# Patient Record
Sex: Male | Born: 1990 | Race: Black or African American | Hispanic: No | Marital: Single | Smoking: Current every day smoker
Health system: Southern US, Community
[De-identification: ages and names within clinical notes are randomized; demographics above are authoritative.]

## PROBLEM LIST (undated history)

## (undated) DIAGNOSIS — R51 Headache: Secondary | ICD-10-CM

## (undated) DIAGNOSIS — R519 Headache, unspecified: Secondary | ICD-10-CM

---
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
## 2011-01-24 LAB — POCT I-STAT, CHEM 8
BUN: 16 mg/dL (ref 6–23)
Chloride: 104 mEq/L (ref 96–112)
Creatinine, Ser: 1.4 mg/dL (ref 0.4–1.5)
Sodium: 139 mEq/L (ref 135–145)
TCO2: 27 mmol/L (ref 0–100)
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
## 2011-05-08 ENCOUNTER — Emergency Department (HOSPITAL_COMMUNITY)
Admission: EM | Admit: 2011-05-08 | Discharge: 2011-05-08 | Disposition: A | Discharge status: Home / Self Care | Payer: Self-pay | Attending: Emergency Medicine | Admitting: Emergency Medicine

## 2011-05-08 ENCOUNTER — Emergency Department (HOSPITAL_COMMUNITY): Payer: Self-pay

## 2011-05-08 DIAGNOSIS — S01501A Unspecified open wound of lip, initial encounter: Secondary | ICD-10-CM | POA: Insufficient documentation

## 2011-05-08 DIAGNOSIS — R51 Headache: Secondary | ICD-10-CM | POA: Insufficient documentation

## 2011-05-08 DIAGNOSIS — S022XXA Fracture of nasal bones, initial encounter for closed fracture: Secondary | ICD-10-CM | POA: Insufficient documentation

## 2011-05-08 DIAGNOSIS — R221 Localized swelling, mass and lump, neck: Secondary | ICD-10-CM | POA: Insufficient documentation

## 2011-05-08 DIAGNOSIS — R220 Localized swelling, mass and lump, head: Secondary | ICD-10-CM | POA: Insufficient documentation

## 2011-05-08 DIAGNOSIS — S025XXA Fracture of tooth (traumatic), initial encounter for closed fracture: Secondary | ICD-10-CM | POA: Insufficient documentation

## 2012-04-05 ENCOUNTER — Encounter (HOSPITAL_COMMUNITY): Payer: Self-pay

## 2012-04-05 ENCOUNTER — Emergency Department (INDEPENDENT_AMBULATORY_CARE_PROVIDER_SITE_OTHER)
Admission: EM | Admit: 2012-04-05 | Discharge: 2012-04-05 | Disposition: A | Discharge status: Home Health | Payer: Self-pay | Source: Home / Self Care | Attending: Emergency Medicine | Admitting: Emergency Medicine

## 2012-04-05 DIAGNOSIS — J020 Streptococcal pharyngitis: Secondary | ICD-10-CM

## 2012-04-05 LAB — POCT RAPID STREP A: Streptococcus, Group A Screen (Direct): POSITIVE — AB

## 2012-04-05 MED ORDER — PENICILLIN G BENZATHINE 1200000 UNIT/2ML IM SUSP
1.2000 10*6.[IU] | Freq: Once | INTRAMUSCULAR | Status: AC
  Administered 2012-04-05: 1.2 10*6.[IU] via INTRAMUSCULAR

## 2012-04-05 MED ORDER — PENICILLIN G BENZATHINE 1200000 UNIT/2ML IM SUSP
INTRAMUSCULAR | Status: AC
  Filled 2012-04-05: qty 2

## 2012-04-05 MED ORDER — HYDROMORPHONE HCL PF 1 MG/ML IJ SOLN
2.0000 mg | Freq: Once | INTRAMUSCULAR | Status: AC
  Administered 2012-04-05: 2 mg via INTRAMUSCULAR

## 2012-04-05 MED ORDER — HYDROMORPHONE HCL PF 1 MG/ML IJ SOLN
INTRAMUSCULAR | Status: AC
  Filled 2012-04-05: qty 2

## 2012-04-05 MED ORDER — ONDANSETRON HCL 4 MG/2ML IJ SOLN
4.0000 mg | Freq: Once | INTRAMUSCULAR | Status: AC
  Administered 2012-04-05: 4 mg via INTRAMUSCULAR

## 2012-04-05 MED ORDER — ONDANSETRON HCL 4 MG/2ML IJ SOLN
INTRAMUSCULAR | Status: AC
  Filled 2012-04-05: qty 2

## 2012-04-05 NOTE — Discharge Instructions (Signed)
Strep Infections Streptococcal (strep) infections are caused by streptococcal germs (bacteria). Strep infections are very contagious. Strep infections can occur in:  Ears.   The nose.   The throat.   Sinuses.   Skin.   Blood.   Lungs.   Spinal fluid.   Urine.  Strep throat is the most common bacterial infection in children. The symptoms of a Strep infection usually get better in 2 to 3 days after starting medicine that kills germs (antibiotics). Strep is usually not contagious after 36 to 48 hours of antibiotic treatment. Strep infections that are not treated can cause serious complications. These include gland infections, throat abscess, rheumatic fever and kidney disease. DIAGNOSIS  The diagnosis of strep is made by:  A culture for the strep germ.  TREATMENT  These infections require oral antibiotics for a full 10 days, an antibiotic shot or antibiotics given into the vein (intravenous, IV). HOME CARE INSTRUCTIONS   Be sure to finish all antibiotics even if feeling better.   Only take over-the-counter medicines for pain, discomfort and or fever, as directed by your caregiver.   Close contacts that have a fever, sore throat or illness symptoms should see their caregiver right away.   You or your child may return to work, school or daycare if the fever and pain are better in 2 to 3 days after starting antibiotics.  SEEK MEDICAL CARE IF:   You or your child has an oral temperature above 102 F (38.9 C).   Your baby is older than 3 months with a rectal temperature of 100.5 F (38.1 C) or higher for more than 1 day.   You or your child is not better in 3 days.  SEEK IMMEDIATE MEDICAL CARE IF:   You or your child has an oral temperature above 102 F (38.9 C), not controlled by medicine.   Your baby is older than 3 months with a rectal temperature of 102 F (38.9 C) or higher.   Your baby is 67 months old or younger with a rectal temperature of 100.4 F (38 C) or  higher.   There is a spreading rash.   There is difficulty swallowing or breathing.   There is increased pain or swelling.  Document Released: 11/03/2004 Document Revised: 09/15/2011 Document Reviewed: 08/12/2009 [facility] Patient Information 2012 [place], [place].   Go to www.goodrx.com to look up your medications. This will give you a list of where you can find your prescriptions at the most affordable prices.   RESOURCE GUIDE  Dental Problems  Look up DebtSupply.pl.asp for a schedule of the [facility]'s free dental clinics called [address] of [place]. They have clinics all around [place]. Get there early and be prepared to wait.   [facility] [address]  [place], [place] [address] [phone]  [facility] [address] [place], [place] [phone]  Patients with Medicaid: [facility] 401 063 [address]                                [phone] [address] [employer] Phone:  [phone]  Phone:  [phone]  If unable to pay or uninsured, contact:  [facility] or [facility]. to become qualified for the adult dental clinic.

## 2012-04-05 NOTE — ED Notes (Signed)
Pt c/o HA and sore throat onset Tuesday. Pt has white patches on throat upon inspection.  Pt took Aleve last night with no relief.

## 2012-04-05 NOTE — ED Provider Notes (Signed)
Chief Complaint  Patient presents with  . Headache  . Sore Throat    History of Present Illness:   Norman Stewart is a 21 year old male with a three-day history of severe sore throat, pain on swallowing, headache, fever, nausea, abdominal pain. He denies any earache, coughing, wheezing, shortness of breath, vomiting, or diarrhea. He thinks he may have been exposed to strep throat in a coworker. He also recalls having strep throat several years ago.  Review of Systems:  Other than as noted above, the patient denies any of the following symptoms. Systemic:  No fever, chills, sweats, fatigue, myalgias, headache, or anorexia. Eye:  No redness, pain or drainage. ENT:  No earache, ear congestion, nasal congestion, sneezing, rhinorrhea, sinus pressure, sinus pain, or post nasal drip. Lungs:  No cough, sputum production, wheezing, shortness of breath, or chest pain. GI:  No abdominal pain, nausea, vomiting, or diarrhea. Skin:  No rash or itching.  PMFSH:  Past medical history, family history, social history, meds, allergies, and nurse's notes were reviewed.  There is no known exposure to strep or mono.  No prior history of step or mono.  The patient denies use of tobacco.  Physical Exam:   Vital signs:  BP 121/73  Pulse 100  Temp 100.1 F (37.8 C) (Oral)  Resp 18  SpO2 97% General:  Alert, in significant distress due to sore throat and headache. Eye:  No conjunctival injection or drainage. Lids were normal. ENT:  TMs and canals were normal, without erythema or inflammation.  Nasal mucosa was clear and uncongested, without drainage.  Mucous membranes were moist.  Exam of pharynx reveals tonsils to be enlarged, red, and coated with white exudate.  There were no oral ulcerations or lesions. Neck:  Supple, with bilateral, tender anterior cervical adenopathy. Lungs:  No respiratory distress.  Lungs were clear to auscultation, without wheezes, rales or rhonchi.  Breath sounds were clear and equal bilaterally.    Heart:  Regular rhythm, without gallops, murmers or rubs. Skin:  Clear, warm, and dry, without rash or lesions.  Labs:   Results for orders placed during the hospital encounter of 04/05/12  POCT RAPID STREP A ([facility] CARE ONLY)      Component Value Range   Streptococcus, Group A Screen (Direct) POSITIVE (*) NEGATIVE   Course in Urgent Care Center:   Due to his severe discomfort, he was given hydromorphone 2 mg IM and Zofran 4 mg IM. Thereafter he was given Bicillin LA 1.2 million units IM. He tolerated all these injections well without any immediate side effects.  Assessment:  The encounter diagnosis was Strep throat.  Plan:   1.  The following meds were prescribed:   New Prescriptions   No medications on file   2.  The patient was instructed in symptomatic care including hot saline gargles, throat lozenges, infectious precautions, and need to trade out toothbrush. Handouts were given. 3.  The patient was told to return if becoming worse in any way, if no better in 3 or 4 days, and given some red flag symptoms that would indicate earlier return.    Reuben Likes, MD 04/05/12 2225
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
## 2013-09-11 ENCOUNTER — Emergency Department (HOSPITAL_COMMUNITY)
Admission: EM | Admit: 2013-09-11 | Discharge: 2013-09-11 | Disposition: A | Discharge status: Home / Self Care | Payer: Self-pay | Attending: Emergency Medicine | Admitting: Emergency Medicine

## 2013-09-11 ENCOUNTER — Encounter (HOSPITAL_COMMUNITY): Payer: Self-pay | Admitting: Emergency Medicine

## 2013-09-11 DIAGNOSIS — K0889 Other specified disorders of teeth and supporting structures: Secondary | ICD-10-CM

## 2013-09-11 DIAGNOSIS — F17200 Nicotine dependence, unspecified, uncomplicated: Secondary | ICD-10-CM | POA: Insufficient documentation

## 2013-09-11 DIAGNOSIS — K089 Disorder of teeth and supporting structures, unspecified: Secondary | ICD-10-CM | POA: Insufficient documentation

## 2013-09-11 MED ORDER — TRAMADOL HCL 50 MG PO TABS: 50.0000 mg | ORAL_TABLET | Freq: Four times a day (QID) | ORAL | Status: DC | PRN

## 2013-09-11 MED ORDER — TRAMADOL HCL 50 MG PO TABS
50.0000 mg | ORAL_TABLET | Freq: Once | ORAL | Status: AC
  Administered 2013-09-11: 50 mg via ORAL
  Filled 2013-09-11: qty 1

## 2013-09-11 MED ORDER — PENICILLIN V POTASSIUM 500 MG PO TABS: 500.0000 mg | ORAL_TABLET | Freq: Four times a day (QID) | ORAL | Status: AC

## 2013-09-11 NOTE — ED Notes (Signed)
Pt. Stated that last Friday feeling a toothache on lower left side. Denies fever, N/V. Laying down tonight and it starting feeling worse tonight not able to eat what he would like too. States gum feels swollen on that side.

## 2013-09-11 NOTE — ED Provider Notes (Signed)
CSN: [id number]     Arrival date & time 09/11/13  2016 History  This chart was scribed for non-physician practitioner Santiago Glad working with Juliet Rude. Rubin Payor, MD by Carl Best, ED Scribe. This patient was seen in room TR11C/TR11C and the patient's care was started at 9:19 PM.     Chief Complaint  Patient presents with  . Dental Pain    Patient is a 22 y.o. male presenting with tooth pain. The history is provided by the patient. No language interpreter was used.  Dental Pain Associated symptoms: no fever    HPI Comments: Norman Stewart is a 22 y.o. male who presents to the Emergency Department complaining of constant, worsening pain to his lower left gum that started 5 days ago.  The patient states that he has taken Pain-Away with no relief in his symptoms.  He denies fever, chills, dysphagia, nausea, and emesis as associated symptoms.  He currently does not have a dentist.    History reviewed. No pertinent past medical history. History reviewed. No pertinent past surgical history. No family history on file. History  Substance Use Topics  . Smoking status: Current Every Day Smoker -- 1.00 packs/day    Types: Cigarettes  . Smokeless tobacco: Not on file  . Alcohol Use: Yes     Comment: weekends    Review of Systems  Constitutional: Negative for fever and chills.  HENT: Positive for dental problem. Negative for trouble swallowing.   Gastrointestinal: Negative for nausea and vomiting.  All other systems reviewed and are negative.    Allergies  Review of patient's allergies indicates no known allergies.  Home Medications  No current outpatient prescriptions on file.  Triage Vitals: BP 138/84  Pulse 92  Temp(Src) 98.5 F (36.9 C) (Oral)  Resp 18  Ht 5\' 6"  (1.676 m)  Wt 164 lb 6 oz (74.56 kg)  BMI 26.54 kg/m2  SpO2 100%  Physical Exam  Nursing note and vitals reviewed. Constitutional: He is oriented to person, place, and time. He appears well-developed and  well-nourished. No distress.  HENT:  Head: Normocephalic and atraumatic.  Mouth/Throat: Uvula is midline, oropharynx is clear and moist and mucous membranes are normal. No trismus in the jaw. Abnormal dentition. No dental abscesses or uvula swelling. No oropharyngeal exudate, posterior oropharyngeal edema, posterior oropharyngeal erythema or tonsillar abscesses.  Poor dental hygiene. Pt able to open and close mouth with out difficulty. Airway intact. Uvula midline. Mild gingival swelling with tenderness over affected area, but no fluctuance. No swelling or tenderness of submental and submandibular regions.  No sublingual tenderness or tongue elevation.  Eyes: Conjunctivae and EOM are normal.  Neck: Normal range of motion and full passive range of motion without pain. Neck supple. No tracheal deviation present.  Cardiovascular: Normal rate, regular rhythm and normal heart sounds.   Pulmonary/Chest: Effort normal and breath sounds normal. No stridor. No respiratory distress. He has no wheezes.  Musculoskeletal: Normal range of motion.  Lymphadenopathy:       Head (right side): No submental, no submandibular, no tonsillar, no preauricular and no posterior auricular adenopathy present.       Head (left side): No submental, no submandibular, no tonsillar, no preauricular and no posterior auricular adenopathy present.    He has no cervical adenopathy.  Neurological: He is alert and oriented to person, place, and time.  Skin: Skin is warm and dry. No rash noted. He is not diaphoretic.  Psychiatric: He has a normal mood and affect. His behavior  is normal.    ED Course  Procedures (including critical care time)  DIAGNOSTIC STUDIES: Oxygen Saturation is 100% on room air, normal by my interpretation.    COORDINATION OF CARE: 9:21 PM- Discussed administering an antibiotic in the ED and discharging the patient with pain medication.  Advised the patient to follow-up with his dentist to get his tooth  removed.  The patient agreed to the treatment plan.    Labs Review Labs Reviewed - No data to display Imaging Review No results found.  EKG Interpretation   None       MDM  No diagnosis found. Patient with toothache.  No gross abscess.  Exam unconcerning for Ludwig's angina or spread of infection.  Will treat with penicillin and pain medicine.  Urged patient to follow-up with dentist.    I personally performed the services described in this documentation, which was scribed in my presence. The recorded information has been reviewed and is accurate.    Santiago Glad, PA-C 09/11/13 2155

## 2013-09-12 NOTE — ED Provider Notes (Signed)
Medical screening examination/treatment/procedure(s) were performed by non-physician practitioner and as supervising physician I was immediately available for consultation/collaboration.  EKG Interpretation   None        Juliet Rude. Rubin Payor, MD 09/12/13 [id number]

## 2014-05-09 ENCOUNTER — Encounter (HOSPITAL_COMMUNITY): Payer: Self-pay | Admitting: Emergency Medicine

## 2014-05-09 ENCOUNTER — Emergency Department (HOSPITAL_COMMUNITY)
Admission: EM | Admit: 2014-05-09 | Discharge: 2014-05-09 | Disposition: A | Discharge status: Home / Self Care | Payer: Self-pay | Attending: Emergency Medicine | Admitting: Emergency Medicine

## 2014-05-09 DIAGNOSIS — S01501A Unspecified open wound of lip, initial encounter: Secondary | ICD-10-CM | POA: Insufficient documentation

## 2014-05-09 DIAGNOSIS — F17200 Nicotine dependence, unspecified, uncomplicated: Secondary | ICD-10-CM | POA: Insufficient documentation

## 2014-05-09 DIAGNOSIS — S01511A Laceration without foreign body of lip, initial encounter: Secondary | ICD-10-CM

## 2014-05-09 MED ORDER — AMOXICILLIN 500 MG PO CAPS: 1000.0000 mg | ORAL_CAPSULE | Freq: Two times a day (BID) | ORAL | Status: DC

## 2014-05-09 MED ORDER — TRAMADOL HCL 50 MG PO TABS: 50.0000 mg | ORAL_TABLET | Freq: Four times a day (QID) | ORAL | Status: DC | PRN

## 2014-05-09 MED ORDER — TRAMADOL HCL 50 MG PO TABS
50.0000 mg | ORAL_TABLET | Freq: Once | ORAL | Status: AC
Start: 2014-05-09 — End: 2014-05-09
  Administered 2014-05-09: 50 mg via ORAL
  Filled 2014-05-09: qty 1

## 2014-05-09 MED ORDER — AMOXICILLIN 500 MG PO CAPS
1000.0000 mg | ORAL_CAPSULE | Freq: Once | ORAL | Status: AC
  Administered 2014-05-09: 1000 mg via ORAL
  Filled 2014-05-09: qty 2
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
## 2014-05-09 NOTE — Discharge Instructions (Signed)
Absorbable Suture Repair Absorbable sutures (stitches) hold skin together so you can heal. Keep skin wounds clean and dry for the next 2 to 3 days. Then, you may gently wash your wound and dress it with an antibiotic ointment as recommended. As your wound begins to heal, the sutures are no longer needed, and they typically begin to fall off. This will take 7 to 10 days. After 10 days, if your sutures are loose, you can remove them by wiping with a clean gauze pad or a cotton ball. Do not pull your sutures out. They should wipe away easily. If after 10 days they do not easily wipe away, have your caregiver take them out. Absorbable sutures may be used deep in a wound to help hold it together. If these stitches are below the skin, the body will absorb them completely in 3 to 4 weeks.  You may need a tetanus shot if:  You cannot remember when you had your last tetanus shot.  You have never had a tetanus shot. If you get a tetanus shot, your arm may swell, get red, and feel warm to the touch. This is common and not a problem. If you need a tetanus shot and you choose not to have one, there is a rare chance of getting tetanus. Sickness from tetanus can be serious. SEEK IMMEDIATE MEDICAL CARE IF:  You have redness in the wound area.  The wound area feels hot to the touch.  You develop swelling in the wound area.  You develop pain.  There is fluid drainage from the wound. Document Released: 11/03/2004 Document Revised: 12/19/2011 Document Reviewed: 02/15/2011 [facility] Patient Information 2015 [place], [place]. This information is not intended to replace advice given to you by your health care provider. Make sure you discuss any questions you have with your health care provider.  Amoxicillin capsules or tablets What is this medicine? AMOXICILLIN (a mox i SIL in) is a penicillin antibiotic. It is used to treat certain kinds of bacterial infections. It will not work for colds, flu, or other viral  infections. This medicine may be used for other purposes; ask your health care provider or pharmacist if you have questions. COMMON BRAND NAME(S): Amoxil, Moxilin, Sumox, Trimox What should I tell my health care provider before I take this medicine? They need to know if you have any of these conditions: -asthma -kidney disease -an unusual or allergic reaction to amoxicillin, other penicillins, cephalosporin antibiotics, other medicines, foods, dyes, or preservatives -pregnant or trying to get pregnant -breast-feeding How should I use this medicine? Take this medicine by mouth with a glass of water. Follow the directions on your prescription label. You may take this medicine with food or on an empty stomach. Take your medicine at regular intervals. Do not take your medicine more often than directed. Take all of your medicine as directed even if you think your are better. Do not skip doses or stop your medicine early. Talk to your pediatrician regarding the use of this medicine in children. While this drug may be prescribed for selected conditions, precautions do apply. Overdosage: If you think you have taken too much of this medicine contact a poison control center or emergency room at once. NOTE: This medicine is only for you. Do not share this medicine with others. What if I miss a dose? If you miss a dose, take it as soon as you can. If it is almost time for your next dose, take only that dose. Do not take  double or extra doses. What may interact with this medicine? -amiloride -birth control pills -chloramphenicol -macrolides -probenecid -sulfonamides -tetracyclines This list may not describe all possible interactions. Give your health care provider a list of all the medicines, herbs, non-prescription drugs, or dietary supplements you use. Also tell them if you smoke, drink alcohol, or use illegal drugs. Some items may interact with your medicine. What should I watch for while using this  medicine? Tell your doctor or health care professional if your symptoms do not improve in 2 or 3 days. Take all of the doses of your medicine as directed. Do not skip doses or stop your medicine early. If you are diabetic, you may get a false positive result for sugar in your urine with certain brands of urine tests. Check with your doctor. Do not treat diarrhea with over-the-counter products. Contact your doctor if you have diarrhea that lasts more than 2 days or if the diarrhea is severe and watery. What side effects may I notice from receiving this medicine? Side effects that you should report to your doctor or health care professional as soon as possible: -allergic reactions like skin rash, itching or hives, swelling of the face, lips, or tongue -breathing problems -dark urine -redness, blistering, peeling or loosening of the skin, including inside the mouth -seizures -severe or watery diarrhea -trouble passing urine or change in the amount of urine -unusual bleeding or bruising -unusually weak or tired -yellowing of the eyes or skin Side effects that usually do not require medical attention (report to your doctor or health care professional if they continue or are bothersome): -dizziness -headache -stomach upset -trouble sleeping This list may not describe all possible side effects. Call your doctor for medical advice about side effects. You may report side effects to FDA at 1-800-FDA-1088. Where should I keep my medicine? Keep out of the reach of children. Store between 68 and 77 degrees F (20 and 25 degrees C). Keep bottle closed tightly. Throw away any unused medicine after the expiration date. NOTE: This sheet is a summary. It may not cover all possible information. If you have questions about this medicine, talk to your doctor, pharmacist, or health care provider.  2015, [employer]. (2007-12-18 14:10:59)
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
## 2014-05-09 NOTE — ED Notes (Signed)
PA at bedside to repair laceration.

## 2014-05-09 NOTE — ED Provider Notes (Signed)
LACERATION REPAIR Performed by: Louann SjogrenVictoria L Mattheu Brodersen Authorized by: Louann SjogrenVictoria L Shoshanah Dapper Consent: Verbal consent obtained. Risks and benefits: risks, benefits and alternatives were discussed Consent given by: patient Patient identity confirmed: provided demographic data Prepped and Draped in normal sterile fashion Wound explored  Laceration Location: left lateral upper lip below the fold  Laceration Length: 3.5 cm  No Foreign Bodies seen or palpated  Anesthesia: local infiltration  Local anesthetic: lidocaine 1% without epinephrine  Anesthetic total: 10 ml  Irrigation method: syringe Amount of cleaning: standard  Skin closure: 4-0 Vicryl Rapide  Number of sutures: 10    Technique: Muscular fascial layer was closed with 1 simple interrupted. Mucosal layer was better approximated after muscular layer closure and was not closed. Subcutaneous layer was closed with 9 SubQ with 1 subcuticular running suture  Bacitracin ointment was used.   Patient tolerance: Patient tolerated the procedure well with no immediate complications.    Louann SjogrenVictoria L Tamia Dial, PA-C 05/09/14 [phone]

## 2014-10-07 ENCOUNTER — Emergency Department (HOSPITAL_COMMUNITY)
Admission: EM | Admit: 2014-10-07 | Discharge: 2014-10-07 | Disposition: A | Discharge status: Home / Self Care | Payer: Self-pay | Attending: Emergency Medicine | Admitting: Emergency Medicine

## 2014-10-07 ENCOUNTER — Encounter (HOSPITAL_COMMUNITY): Payer: Self-pay | Admitting: Emergency Medicine

## 2014-10-07 DIAGNOSIS — Z720 Tobacco use: Secondary | ICD-10-CM | POA: Insufficient documentation

## 2014-10-07 DIAGNOSIS — Z792 Long term (current) use of antibiotics: Secondary | ICD-10-CM | POA: Insufficient documentation

## 2014-10-07 DIAGNOSIS — J029 Acute pharyngitis, unspecified: Secondary | ICD-10-CM | POA: Insufficient documentation

## 2014-10-07 LAB — RAPID STREP SCREEN (MED CTR MEBANE ONLY): STREPTOCOCCUS, GROUP A SCREEN (DIRECT): NEGATIVE

## 2014-10-07 MED ORDER — LIDOCAINE VISCOUS 2 % MT SOLN: 20.0000 mL | OROMUCOSAL | Status: DC | PRN

## 2014-10-07 MED ORDER — NAPROXEN 500 MG PO TABS: 500.0000 mg | ORAL_TABLET | Freq: Two times a day (BID) | ORAL | Status: DC

## 2014-10-07 NOTE — ED Notes (Signed)
Pt reports sore throat x5 days. Pt sore red and swollen. Pt also has headache and reports not feeling well in general. No breathing issues.

## 2014-10-07 NOTE — ED Provider Notes (Signed)
CSN: [id number]     Arrival date & time 10/07/14  1311 History  This chart was scribed for Santiago GladHeather Sanaiya Welliver, PA-C with Merrie RoofJohn David Wofford III, MD by Tonye RoyaltyJoshua Chen, ED Scribe. This patient was seen in room WTR6/WTR6 and the patient's care was started at 1:51 PM.    No chief complaint on file.  The history is provided by the patient. No language interpreter was used.    HPI Comments: Norman Stewart is a 23 y.o. male who presents to the Emergency Department complaining of sore throat with onset 2-3 days ago, worsening gradually. He reports associated fever, chills, cough and difficulty swallowing. However, he has not taken his temperature.  Temp 98 upon arrival in the ED.  He notes some white material in his pharynx. He states he has had strep throat and tonsillitis before; he still has his tonsils. He denies recent contact with anyone that has strep throat. Denies nausea, vomiting, or difficulty swallowing.    No past medical history on file. No past surgical history on file. No family history on file. History  Substance Use Topics  . Smoking status: Current Every Day Smoker -- 0.50 packs/day    Types: Cigarettes  . Smokeless tobacco: Not on file  . Alcohol Use: Yes     Comment: weekends    Review of Systems  Constitutional: Positive for fever and chills.  HENT: Positive for sore throat. Negative for trouble swallowing and voice change.   Respiratory: Positive for cough.       Allergies  Review of patient's allergies indicates no known allergies.  Home Medications   Prior to Admission medications   Medication Sig Start Date End Date Taking? Authorizing Provider  amoxicillin (AMOXIL) 500 MG capsule Take 2 capsules (1,000 mg total) by mouth 2 (two) times daily. 05/09/14   Dione Boozeavid Glick, MD  traMADol (ULTRAM) 50 MG tablet Take 1 tablet (50 mg total) by mouth every 6 (six) hours as needed. 05/09/14   Dione Boozeavid Glick, MD   BP 120/88 mmHg  Pulse 94  Temp(Src) 98 F (36.7 C) (Oral)   Resp 17  SpO2 98% Physical Exam  Constitutional: He is oriented to person, place, and time. He appears well-developed and well-nourished.  HENT:  Head: Normocephalic and atraumatic.  Right Ear: Tympanic membrane and ear canal normal.  Left Ear: Tympanic membrane and ear canal normal.  Mouth/Throat: Uvula is midline. Oropharyngeal exudate, posterior oropharyngeal edema and posterior oropharyngeal erythema present. No tonsillar abscesses.  Mild erythema and edema of the oropharynx Normal voice phonation Handling secretions well, no drooling  Eyes: Conjunctivae are normal.  Neck: Normal range of motion. Neck supple.  Cardiovascular: Normal rate, regular rhythm and normal heart sounds.   No murmur heard. Pulmonary/Chest: Effort normal and breath sounds normal. No respiratory distress. He has no wheezes. He has no rales.  Musculoskeletal: Normal range of motion.  Neurological: He is alert and oriented to person, place, and time.  Skin: Skin is warm and dry.  Psychiatric: He has a normal mood and affect.  Nursing note and vitals reviewed.   ED Course  Procedures (including critical care time)  DIAGNOSTIC STUDIES: Oxygen Saturation is 98% on room air, normal by my interpretation.    COORDINATION OF CARE: 1:55 PM Discussed treatment plan with patient at beside, the patient agrees with the plan and has no further questions at this time.   Labs Review Labs Reviewed - No data to display  Imaging Review No results found.   EKG Interpretation  None      MDM   Final diagnoses:  None   Patient presents today with sore throat.  Rapid strep negative.  Culture sent.  No signs of Peritonsillar Abscess or Retropharyngeal Abscess.  Patient non toxic appearing.  Feel that the patient is stable for discharge.  Return precautions given.     Santiago GladHeather Onyx Edgley, PA-C 10/08/14 2251  Santiago GladHeather Jerian Morais, PA-C 10/08/14 2251  Merrie RoofJohn David Wofford III, MD 10/09/14 [phone]

## 2014-10-09 LAB — CULTURE, GROUP A STREP

## 2014-10-22 ENCOUNTER — Ambulatory Visit: Payer: Self-pay

## 2014-10-23 ENCOUNTER — Ambulatory Visit: Payer: Self-pay

## 2014-11-23 ENCOUNTER — Emergency Department (HOSPITAL_COMMUNITY)
Admission: EM | Admit: 2014-11-23 | Discharge: 2014-11-23 | Disposition: A | Discharge status: Home / Self Care | Payer: Self-pay | Attending: Emergency Medicine | Admitting: Emergency Medicine

## 2014-11-23 ENCOUNTER — Encounter (HOSPITAL_COMMUNITY): Payer: Self-pay

## 2014-11-23 DIAGNOSIS — K029 Dental caries, unspecified: Secondary | ICD-10-CM | POA: Insufficient documentation

## 2014-11-23 DIAGNOSIS — K088 Other specified disorders of teeth and supporting structures: Secondary | ICD-10-CM | POA: Insufficient documentation

## 2014-11-23 DIAGNOSIS — K0889 Other specified disorders of teeth and supporting structures: Secondary | ICD-10-CM

## 2014-11-23 DIAGNOSIS — Z791 Long term (current) use of non-steroidal anti-inflammatories (NSAID): Secondary | ICD-10-CM | POA: Insufficient documentation

## 2014-11-23 DIAGNOSIS — Z720 Tobacco use: Secondary | ICD-10-CM | POA: Insufficient documentation

## 2014-11-23 DIAGNOSIS — Z792 Long term (current) use of antibiotics: Secondary | ICD-10-CM | POA: Insufficient documentation

## 2014-11-23 MED ORDER — IBUPROFEN 800 MG PO TABS: 800.0000 mg | ORAL_TABLET | Freq: Three times a day (TID) | ORAL | Status: DC | PRN

## 2014-11-23 MED ORDER — PENICILLIN V POTASSIUM 500 MG PO TABS: 500.0000 mg | ORAL_TABLET | Freq: Four times a day (QID) | ORAL | Status: AC

## 2014-11-23 MED ORDER — HYDROCODONE-ACETAMINOPHEN 5-325 MG PO TABS: 1.0000 | ORAL_TABLET | ORAL | Status: DC | PRN

## 2014-11-23 NOTE — ED Provider Notes (Signed)
CSN: [id number]     Arrival date & time 11/23/14  [id number] History   First MD Initiated Contact with Patient 11/23/14 [phone]     No chief complaint on file.    (Consider location/radiation/quality/duration/timing/severity/associated sxs/prior Treatment) The history is provided by the patient.     Patient presents with throbbing right sided dental pain x 1 week.  Worse with eating and drinking.  Denies fevers, sore throat, difficulty swallowing or breathing.    No past medical history on file. No past surgical history on file. No family history on file. History  Substance Use Topics  . Smoking status: Current Every Day Smoker -- 0.50 packs/day    Types: Cigarettes  . Smokeless tobacco: Not on file  . Alcohol Use: Yes     Comment: weekends    Review of Systems  All other systems reviewed and are negative.     Allergies  Review of patient's allergies indicates no known allergies.  Home Medications   Prior to Admission medications   Medication Sig Start Date End Date Taking? Authorizing Provider  amoxicillin (AMOXIL) 500 MG capsule Take 2 capsules (1,000 mg total) by mouth 2 (two) times daily. 05/09/14   Dione Boozeavid Glick, MD  lidocaine (XYLOCAINE) 2 % solution Use as directed 20 mLs in the mouth or throat as needed for mouth pain. 10/07/14   Heather Laisure, PA-C  naproxen (NAPROSYN) 500 MG tablet Take 1 tablet (500 mg total) by mouth 2 (two) times daily. 10/07/14   Heather Laisure, PA-C  traMADol (ULTRAM) 50 MG tablet Take 1 tablet (50 mg total) by mouth every 6 (six) hours as needed. 05/09/14   Dione Boozeavid Glick, MD   BP 148/98 mmHg  Pulse 69  Temp(Src) 98.5 F (36.9 C)  Resp 18  Ht 5\' 5"  (1.651 m)  Wt 155 lb (70.308 kg)  BMI 25.79 kg/m2  SpO2 98% Physical Exam  Constitutional: He appears well-developed and well-nourished. No distress.  HENT:  Head: Normocephalic and atraumatic.  Mouth/Throat: Uvula is midline and oropharynx is clear and moist. Mucous membranes are not dry. No  uvula swelling. No oropharyngeal exudate, posterior oropharyngeal edema, posterior oropharyngeal erythema or tonsillar abscesses.  Multiple areas of severe dental decay.  Right lower 2nd molar identified as painful.  No tenderness to percussion.  No edema.  No facial swelling.    Neck: Normal range of motion. Neck supple.  Cardiovascular: Normal rate.   Pulmonary/Chest: Effort normal and breath sounds normal. No stridor.  Lymphadenopathy:    He has no cervical adenopathy.  Neurological: He is alert.  Skin: He is not diaphoretic.  Nursing note and vitals reviewed.   ED Course  Procedures (including critical care time) Labs Review Labs Reviewed - No data to display  Imaging Review No results found.   EKG Interpretation None      MDM   Final diagnoses:  Pain, dental  Dental caries    Afebrile, nontoxic patient with new dental pain.  No obvious abscess.  No concerning findings on exam.  Doubt deep space head or neck infection.  Doubt Ludwig's angina.  D/C home with antibiotic, pain medication and dental follow up.  Discussed findings, treatment, and follow up  with patient.  Pt given return precautions.  Pt verbalizes understanding and agrees with plan.        Trixie Dredgemily Ciarah Peace, PA-C 11/23/14 [phone]  Candyce ChurnJohn David Wofford III, MD 11/23/14 [phone]

## 2014-11-23 NOTE — Discharge Instructions (Signed)
Read the information below.  Use the prescribed medication as directed.  Please discuss all new medications with your pharmacist.  Do not take additional tylenol while taking the prescribed pain medication to avoid overdose.  You may return to the Emergency Department at any time for worsening condition or any new symptoms that concern you.  Please call the dentist to schedule a close follow up appointment.  If you develop fevers, swelling in your face, difficulty swallowing or breathing, return to the ER immediately for a recheck.     Dental Caries Dental caries (also called tooth decay) is the most common oral disease. It can occur at any age but is more common in children and young adults.  HOW DENTAL CARIES DEVELOPS  The process of decay begins when bacteria and foods (particularly sugars and starches) combine in your mouth to produce plaque. Plaque is a substance that sticks to the hard, outer surface of a tooth (enamel). The bacteria in plaque produce acids that attack enamel. These acids may also attack the root surface of a tooth (cementum) if it is exposed. Repeated attacks dissolve these surfaces and create holes in the tooth (cavities). If left untreated, the acids destroy the other layers of the tooth.  RISK FACTORS  Frequent sipping of sugary beverages.   Frequent snacking on sugary and starchy foods, especially those that easily get stuck in the teeth.   Poor oral hygiene.   Dry mouth.   Substance abuse such as methamphetamine abuse.   Broken or poor-fitting dental restorations.   Eating disorders.   Gastroesophageal reflux disease (GERD).   Certain radiation treatments to the head and neck. SYMPTOMS In the early stages of dental caries, symptoms are seldom present. Sometimes white, chalky areas may be seen on the enamel or other tooth layers. In later stages, symptoms may include:  Pits and holes on the enamel.  Toothache after sweet, hot, or cold foods or drinks  are consumed.  Pain around the tooth.  Swelling around the tooth. DIAGNOSIS  Most of the time, dental caries is detected during a regular dental checkup. A diagnosis is made after a thorough medical and dental history is taken and the surfaces of your teeth are checked for signs of dental caries. Sometimes special instruments, such as lasers, are used to check for dental caries. Dental X-ray exams may be taken so that areas not visible to the eye (such as between the contact areas of the teeth) can be checked for cavities.  TREATMENT  If dental caries is in its early stages, it may be reversed with a fluoride treatment or an application of a remineralizing agent at the dental office. Thorough brushing and flossing at home is needed to aid these treatments. If it is in its later stages, treatment depends on the location and extent of tooth destruction:   If a small area of the tooth has been destroyed, the destroyed area will be removed and cavities will be filled with a material such as gold, silver amalgam, or composite resin.   If a large area of the tooth has been destroyed, the destroyed area will be removed and a cap (crown) will be fitted over the remaining tooth structure.   If the center part of the tooth (pulp) is affected, a procedure called a root canal will be needed before a filling or crown can be placed.   If most of the tooth has been destroyed, the tooth may need to be pulled (extracted). HOME CARE INSTRUCTIONS  You can prevent, stop, or reverse dental caries at home by practicing good oral hygiene. Good oral hygiene includes:  Thoroughly cleaning your teeth at least twice a day with a toothbrush and dental floss.   Using a fluoride toothpaste. A fluoride mouth rinse may also be used if recommended by your dentist or health care provider.   Restricting the amount of sugary and starchy foods and sugary liquids you consume.   Avoiding frequent snacking on these foods and  sipping of these liquids.   Keeping regular visits with a dentist for checkups and cleanings. PREVENTION   Practice good oral hygiene.  Consider a dental sealant. A dental sealant is a coating material that is applied by your dentist to the pits and grooves of teeth. The sealant prevents food from being trapped in them. It may protect the teeth for several years.  Ask about fluoride supplements if you live in a community without fluorinated water or with water that has a low fluoride content. Use fluoride supplements as directed by your dentist or health care provider.  Allow fluoride varnish applications to teeth if directed by your dentist or health care provider. Document Released: 06/18/2002 Document Revised: 02/10/2014 Document Reviewed: 09/28/2012 [facility] Patient Information 2015 [employer], [place]. This information is not intended to replace advice given to you by your health care provider. Make sure you discuss any questions you have with your health care provider.  Dental Pain A tooth ache may be caused by cavities (tooth decay). Cavities expose the nerve of the tooth to air and hot or cold temperatures. It may come from an infection or abscess (also called a boil or furuncle) around your tooth. It is also often caused by dental caries (tooth decay). This causes the pain you are having. DIAGNOSIS  Your caregiver can diagnose this problem by exam. TREATMENT   If caused by an infection, it may be treated with medications which kill germs (antibiotics) and pain medications as prescribed by your caregiver. Take medications as directed.  Only take over-the-counter or prescription medicines for pain, discomfort, or fever as directed by your caregiver.  Whether the tooth ache today is caused by infection or dental disease, you should see your dentist as soon as possible for further care. SEEK MEDICAL CARE IF: The exam and treatment you received today has been provided on an emergency  basis only. This is not a substitute for complete medical or dental care. If your problem worsens or new problems (symptoms) appear, and you are unable to meet with your dentist, call or return to this location. SEEK IMMEDIATE MEDICAL CARE IF:   You have a fever.  You develop redness and swelling of your face, jaw, or neck.  You are unable to open your mouth.  You have severe pain uncontrolled by pain medicine. MAKE SURE YOU:   Understand these instructions.  Will watch your condition.  Will get help right away if you are not doing well or get worse. Document Released: 09/26/2005 Document Revised: 12/19/2011 Document Reviewed: 05/14/2008 [facility] Patient Information 2015 [place], [place]. This information is not intended to replace advice given to you by your health care provider. Make sure you discuss any questions you have with your health care provider.   Emergency Department Resource Guide 1) Find a Doctor and Pay Out of Pocket Although you won't have to find out who is covered by your insurance plan, it is a good idea to ask around and get recommendations. You will then need to call the  office and see if the doctor you have chosen will accept you as a new patient and what types of options they offer for patients who are self-pay. Some doctors offer discounts or will set up payment plans for their patients who do not have insurance, but you will need to ask so you aren't surprised when you get to your appointment.  2) Contact Your Local Health Department Not all health departments have doctors that can see patients for sick visits, but many do, so it is worth a call to see if yours does. If you don't know where your local health department is, you can check in your phone book. The CDC also has a tool to help you locate your state's health department, and many state websites also have listings of all of their local health departments.  3) Find a Walk-in Clinic If your illness is not  likely to be very severe or complicated, you may want to try a walk in clinic. These are popping up all over the country in pharmacies, drugstores, and shopping centers. They're usually staffed by nurse practitioners or physician assistants that have been trained to treat common illnesses and complaints. They're usually fairly quick and inexpensive. However, if you have serious medical issues or chronic medical problems, these are probably not your best option.  No Primary Care Doctor: - Call Health Connect at  [phone] - they can help you locate a primary care doctor that  accepts your insurance, provides certain services, etc. - Physician Referral Service- [phone]  Chronic Pain Problems: Organization         Address  Phone   Notes  [facility]  [phone] Patients need to be referred by their primary care doctor.   Medication Assistance: Organization         Address  Phone   Notes  [facility] Medication [facility] [address] [place], [place] [address] [phone] --Must be a resident of [facility] -- Must have NO insurance coverage whatsoever (no Medicaid/ Medicare, etc.) -- The pt. MUST have a primary care doctor that directs their care regularly and follows them in the community   [facility]  [phone]   [employer]  [phone]    Agencies that provide inexpensive medical care: Organization         Address  Phone   Notes  [facility]  [phone]   [facility]    [phone]   [facility] [address] [place], [place] [address] [phone]   [facility] [address] [place], [place] [phone]   [facility]anned Parenthood    [phone]   [facility]    [phone]   [facility]  [address], [place] Phone:  [phone], Fax:  [phone] Hours of Operation:  9 am - 6 pm,  M-F.  Also accepts Medicaid/Medicare and self-pay.  [facility]  [address], [place] Phone: [phone], Fax: [phone]. Hours of Operation:  8:30 am - 5:30 pm, M-F.  Also accepts Medicaid and self-pay.  [facility] [place] [address] [place] Phone: [phone]   [facility] [address] Natasha Bence [place], [place] [phone], Ext. [phone] Mondays & Thursdays: 7-9 AM.  First 15 patients are seen on a first come, first serve basis.    Medicaid-accepting  [facility] Providers:  Organization         Address  Phone   Notes  [facility] [address], [place] [phone] Also accepts self-pay patients.  [facility] [address] Laurell Josephs [place], [place]  [phone]   [facility] [address], [place] [phone]   [facility] [address] [place] [phone]   Renaye Rakers [address], [place]   [phone] Only accepts [place] Access [place] patients after they have their name applied to their card.   Self-Pay (no insurance) in [facility]:  Organization         Address  Phone   Notes  Sickle Cell Patients, [facility] [address] [place], [place] [phone]   [facility] [address] [place], [place] [phone]   [facility] [place]  [address], [place] [phone]   [facility]/Dr. Osei-Bonsu  [address] [place] or [address], [place] [phone] Phone number for both [place] and [place] locations is the same.  [facility] [address] [place] [phone]   [facility] [address] [place] or [address] [phone]   [facility] [address] [place] [phone], phone; [phone], fax Sees patients 1st and 3rd Saturday of every month.  Must not qualify for public or private insurance (i.e. Medicaid, Medicare, [place] Health Choice, Veterans' Benefits)  Household income should be no more than 200% of the poverty level The clinic cannot treat you if you are pregnant or think you are pregnant  Sexually transmitted diseases are not treated at the clinic.    Dental Care: Organization         Address  Phone  Notes  [facility] Department of [facility] [address] [place], [place] [phone] Accepts children up to age 69 who are enrolled in [place] or [place] Health Choice; pregnant women with a Medicaid card; and children who have applied for Medicaid or [place] Health Choice, but were declined, whose parents can pay a reduced fee at time of service.  [facility]  [address], [place] [phone] Accepts children up to age 19 who are enrolled in [place] or [place] Health Choice; pregnant women with a Medicaid card; and children who have applied for Medicaid or [place] Health Choice, but were declined, whose parents can pay a reduced fee at time of service.  [facility]  [address] [place], [place] [phone] Patients are seen by appointment only. Walk-ins are not accepted. [facility] will see patients 24 years of age and older. Monday - Tuesday (8am-5pm) Most Wednesdays (8:30-5pm) $30 per visit, cash only  [facility]  [address], [facility] [phone] Patients are seen by appointment only. Walk-ins are not accepted. [facility] will see patients 52 years of age and older. One Wednesday Evening (Monthly: Volunteer Based).  $30 per visit, cash only  [employer]  [phone] for adults; Children under age 11, call Graduate Pediatric Dentistry at [phone]. Children aged 23-14, please call [phone] to request a pediatric  application.  Dental services are provided in all areas of dental care including fillings, crowns and bridges, complete and partial dentures, implants, gum treatment, root canals, and extractions. Preventive care is also provided. Treatment is provided to both adults and children. Patients are selected via a lottery and there is often a waiting list.   [facility] [address], [place]  [phone] [email or web address]   [facility] [address] [place], [place] [phone], Ext. [phone] Second and Fourth Thursday of each month, opens at 6:30 AM; Clinic ends at 9 AM.  Patients are seen on a first-come first-served basis, and a limited number are seen during each clinic.   [facility]  [address] Ether Griffins [place], [place] [phone]   Eligibility Requirements You must have lived in [place], [place], or [place] counties for at least the last three months.   You cannot be eligible for state or federal sponsored [place], including [employer], [place], or [employer].   You generally cannot be eligible for healthcare insurance through your employer.    How to apply: Eligibility screenings are held every Tuesday and Wednesday afternoon from 1:00 pm until 4:00 pm. You do not need an appointment for the interview!  [facility] [address], [place], [place] [phone]   [facility]  [phone]   [facility] Health Department  [phone]   [facility] Health Department  [phone]    Behavioral Health Resources in the Community: Intensive Outpatient Programs Organization         Address  Phone  Notes  [facility] [address] [place], [place] [phone]   [facility] Outpatient [address] [place], [place] [phone]   [facility]  [address] [place], [place]  [phone]   [facility] Mental Health [address],  [place], [place] [phone] or [phone]   Substance Abuse Resources Organization         Address  Phone  Notes  [facility]  [phone]   [facility]  [phone]   [facility] [place]  [phone]   Floydene Flock  [phone]   Residential & Outpatient Substance Abuse Program  [phone]   Psychological Services Organization         Address  Phone  Notes  [facility]  [phone]   [facility] Services  [phone]   [facility] [address], [place] [phone] or [phone]    Mobile Crisis Teams Organization         Address  Phone  Notes  [facility], Mobile Crisis Care Unit  [phone]   [facility]  [address] [place], [place] [phone]   Doristine Locks [address] [place] [phone]    Self-Help/Support Groups Organization         Address  Phone             Notes  [facility] [place] - variety of support groups  336- I7437963 Call for more information  Narcotics Anonymous (NA), [facility] [address], [employer] [place]  2 meetings at this location   Statistician         Address  Phone  Notes  [facility] [address],    [place]  [phone]   [facility]  [address], [place] [address], [place],  [facility] [phone]   [facility] [address] [place], [place] [phone] Admissions: 8am-3pm M-F  [facility] [address],    Presidential [place], [place] [phone]   [facility] [address] [place], [place], [place] [phone]   [facility] [address]  [place], [place] [phone]   [facility] - Intensive Outpatient [address] Laurell Josephs [address], [place], [place]  [phone]   [facility] ([facility] [address] [place]  [place], [place] [phone] or [phone]   [facility] ([facility]) [address], [place], [place] [phone] Accepts Medicaid  [facility] [address]  [place] [phone] Substance Abuse/Addiction Treatment   [facility] Organization         Address  Phone  Notes  [facility]  [phone]   Angie Fava, PhD [address] Ervin Knack [place], [place]   [phone] or [phone]   [facility]al   [address] [place], [place] [phone]   [facility] [address] [place], [place] [phone] Insurance/Medicaid/sponsorship through [facility] Families [address]                                    [place], [place] [phone] Therapy/tele-psych/case  [facility] [address] [place] [address], [place] [phone]    Dr. Lolly Mustache  [phone]   [facility] [place]  [employer] [facility] Dept. 1) [address] [place] 2) [address], [place] 3)  [address], [place] [phone]  [phone]   [facility] Child Abuse Hotline 530-193-0746 or [phone] (After Hours)

## 2014-11-23 NOTE — ED Notes (Signed)
Pt reports he has had a toothache for approximately 1 week. Taken tylenol at home with no relief. No facial swelling noted.

## 2014-11-24 ENCOUNTER — Ambulatory Visit: Payer: Self-pay

## 2014-12-09 DIAGNOSIS — K088 Other specified disorders of teeth and supporting structures: Secondary | ICD-10-CM | POA: Insufficient documentation

## 2014-12-09 DIAGNOSIS — Z720 Tobacco use: Secondary | ICD-10-CM | POA: Insufficient documentation

## 2014-12-09 DIAGNOSIS — K029 Dental caries, unspecified: Secondary | ICD-10-CM | POA: Insufficient documentation

## 2014-12-09 DIAGNOSIS — K002 Abnormalities of size and form of teeth: Secondary | ICD-10-CM | POA: Insufficient documentation

## 2014-12-09 DIAGNOSIS — Z792 Long term (current) use of antibiotics: Secondary | ICD-10-CM | POA: Insufficient documentation

## 2014-12-09 DIAGNOSIS — Z791 Long term (current) use of non-steroidal anti-inflammatories (NSAID): Secondary | ICD-10-CM | POA: Insufficient documentation

## 2014-12-10 ENCOUNTER — Emergency Department (HOSPITAL_COMMUNITY)
Admission: EM | Admit: 2014-12-10 | Discharge: 2014-12-10 | Disposition: A | Discharge status: Home / Self Care | Payer: Self-pay | Attending: Emergency Medicine | Admitting: Emergency Medicine

## 2014-12-10 ENCOUNTER — Encounter (HOSPITAL_COMMUNITY): Payer: Self-pay | Admitting: Emergency Medicine

## 2014-12-10 DIAGNOSIS — K029 Dental caries, unspecified: Secondary | ICD-10-CM

## 2014-12-10 MED ORDER — CLINDAMYCIN HCL 150 MG PO CAPS: 300.0000 mg | ORAL_CAPSULE | Freq: Three times a day (TID) | ORAL | Status: DC

## 2014-12-10 MED ORDER — HYDROCODONE-ACETAMINOPHEN 5-325 MG PO TABS: 1.0000 | ORAL_TABLET | Freq: Four times a day (QID) | ORAL | Status: DC | PRN

## 2014-12-10 NOTE — ED Provider Notes (Signed)
CSN: [id number]     Arrival date & time 12/09/14  2354 History   First MD Initiated Contact with Patient 12/10/14 0037     Chief Complaint  Patient presents with  . Dental Pain     (Consider location/radiation/quality/duration/timing/severity/associated sxs/prior Treatment) Patient is a 24 y.o. male presenting with tooth pain. The history is provided by the patient and medical records. No language interpreter was used.  Dental Pain Associated symptoms: no drooling, no facial swelling, no fever, no headaches and no neck pain      Norman Stewart is a 24 y.o. male  with a hx of no major medical history presents to the Emergency Department complaining of gradual, persistent, progressively worsening right-sided upper dental pain beginning 3 days ago. Patient reports he was seen 2-1/2 weeks ago for same. He has not followed up with dentistry. He reports pain is worse with eating and drinking. Nothing makes it better. No treatments prior to arrival. He denies sore throat, fever, nausea, vomiting, difficulty swallowing or trouble breathing.     History reviewed. No pertinent past medical history. History reviewed. No pertinent past surgical history. No family history on file. History  Substance Use Topics  . Smoking status: Current Every Day Smoker -- 0.50 packs/day    Types: Cigarettes  . Smokeless tobacco: Not on file  . Alcohol Use: Yes     Comment: weekends    Review of Systems  Constitutional: Negative for fever, chills and appetite change.  HENT: Positive for dental problem. Negative for drooling, ear pain, facial swelling, nosebleeds, postnasal drip, rhinorrhea and trouble swallowing.   Eyes: Negative for pain and redness.  Respiratory: Negative for cough and wheezing.   Cardiovascular: Negative for chest pain.  Gastrointestinal: Negative for nausea, vomiting and abdominal pain.  Musculoskeletal: Negative for neck pain and neck stiffness.  Skin: Negative for color change  and rash.  Neurological: Negative for weakness, light-headedness and headaches.  All other systems reviewed and are negative.     Allergies  Review of patient's allergies indicates no known allergies.  Home Medications   Prior to Admission medications   Medication Sig Start Date End Date Taking? Authorizing Provider  amoxicillin (AMOXIL) 500 MG capsule Take 2 capsules (1,000 mg total) by mouth 2 (two) times daily. 05/09/14   Dione Booze, MD  clindamycin (CLEOCIN) 150 MG capsule Take 2 capsules (300 mg total) by mouth 3 (three) times daily. May dispense as  capsules 12/10/14   Isley Weisheit, PA-C  HYDROcodone-acetaminophen (NORCO/VICODIN) 5-325 MG per tablet Take 1-2 tablets by mouth every 6 (six) hours as needed for moderate pain or severe pain. 12/10/14   Kesha Hurrell, PA-C  ibuprofen (ADVIL,MOTRIN) 800 MG tablet Take 1 tablet (800 mg total) by mouth every 8 (eight) hours as needed for mild pain or moderate pain. 11/23/14   Trixie Dredge, PA-C  lidocaine (XYLOCAINE) 2 % solution Use as directed 20 mLs in the mouth or throat as needed for mouth pain. 10/07/14   Heather Laisure, PA-C  naproxen (NAPROSYN) 500 MG tablet Take 1 tablet (500 mg total) by mouth 2 (two) times daily. 10/07/14   Heather Laisure, PA-C  traMADol (ULTRAM) 50 MG tablet Take 1 tablet (50 mg total) by mouth every 6 (six) hours as needed. 05/09/14   Dione Booze, MD   BP 128/87 mmHg  Pulse 77  Temp(Src) 97.9 F (36.6 C) (Oral)  Resp 16  Ht  (1.651 m)  Wt 155 lb (70.308 kg)  BMI 25.79 kg/m2  SpO2 98% Physical Exam  Constitutional: He appears well-developed and well-nourished.  HENT:  Head: Normocephalic.  Right Ear: Tympanic membrane, external ear and ear canal normal.  Left Ear: Tympanic membrane, external ear and ear canal normal.  Nose: Nose normal. Right sinus exhibits no maxillary sinus tenderness and no frontal sinus tenderness. Left sinus exhibits no maxillary sinus tenderness and no frontal  sinus tenderness.  Mouth/Throat: Uvula is midline, oropharynx is clear and moist and mucous membranes are normal. No oral lesions. Abnormal dentition. Dental caries present. No uvula swelling or lacerations. No oropharyngeal exudate, posterior oropharyngeal edema, posterior oropharyngeal erythema or tonsillar abscesses.  Poor dentition throughout No gingival swelling, fluctuance or induration No gross abscess Tooth #1 is broken  Eyes: Conjunctivae are normal. Pupils are equal, round, and reactive to light. Right eye exhibits no discharge. Left eye exhibits no discharge.  Neck: Normal range of motion. Neck supple.  No stridor Handling secretions without difficulty No nuchal rigidity No cervical lymphadenopathy   Cardiovascular: Normal rate, regular rhythm and normal heart sounds.   Pulmonary/Chest: Effort normal. No respiratory distress.  Equal chest rise  Abdominal: Soft. Bowel sounds are normal. He exhibits no distension. There is no tenderness.  Lymphadenopathy:    He has no cervical adenopathy.  Neurological: He is alert.  Skin: Skin is warm and dry.  Psychiatric: He has a normal mood and affect.  Nursing note and vitals reviewed.   ED Course  Procedures (including critical care time) Labs Review Labs Reviewed - No data to display  Imaging Review No results found.   EKG Interpretation None      MDM   Final diagnoses:  Pain due to dental caries   Norman Stewart presents with dental pain.  No gross abscess.  Exam unconcerning for Ludwig's angina or spread of infection.  Will treat with clindamycin and pain medicine.  Strongly urged patient to follow-up with dentist.    I have personally reviewed patient's vitals, nursing note and any pertinent labs or imaging.  I performed an focused physical exam; undressed when appropriate .    It has been determined that no acute conditions requiring further emergency intervention are present at this time. The  patient/guardian have been advised of the diagnosis and plan. I reviewed any labs and imaging including any potential incidental findings. We have discussed signs and symptoms that warrant return to the ED and they are listed in the discharge instructions.    Vital signs are stable at discharge.   BP 128/87 mmHg  Pulse 77  Temp(Src) 97.9 F (36.6 C) (Oral)  Resp 16  Ht 5\' 5"  (1.651 m)  Wt 155 lb (70.308 kg)  BMI 25.79 kg/m2  SpO2 98%         Dierdre ForthHannah Brycelynn Stampley, PA-C 12/10/14 0057  Olivia Mackielga M Otter, MD 12/10/14 [phone]

## 2014-12-10 NOTE — ED Notes (Signed)
Pt c/o toothache onset earlier today.  C/O pain in right upper and lower back teeth

## 2014-12-10 NOTE — Discharge Instructions (Signed)
1. Medications: vicodin, clindamycin, usual home medications 2. Treatment: rest, drink plenty of fluids, take medications as prescribed 3. Follow Up: Please followup with dentistry within 1 week for discussion of your diagnoses and further evaluation after today's visit; if you do not have a primary care doctor use the resource guide provided to find one; Return to the ER for high fevers, difficulty breathing, difficulty swallowing or other concerning symptoms    Emergency Department Resource Guide 1) Find a Doctor and Pay Out of Pocket Although you won't have to find out who is covered by your insurance plan, it is a good idea to ask around and get recommendations. You will then need to call the office and see if the doctor you have chosen will accept you as a new patient and what types of options they offer for patients who are self-pay. Some doctors offer discounts or will set up payment plans for their patients who do not have insurance, but you will need to ask so you aren't surprised when you get to your appointment.  2) Contact Your Local Health Department Not all health departments have doctors that can see patients for sick visits, but many do, so it is worth a call to see if yours does. If you don't know where your local health department is, you can check in your phone book. The CDC also has a tool to help you locate your state's health department, and many state websites also have listings of all of their local health departments.  3) Find a Walk-in Clinic If your illness is not likely to be very severe or complicated, you may want to try a walk in clinic. These are popping up all over the country in pharmacies, drugstores, and shopping centers. They're usually staffed by nurse practitioners or physician assistants that have been trained to treat common illnesses and complaints. They're usually fairly quick and inexpensive. However, if you have serious medical issues or chronic medical  problems, these are probably not your best option.  No Primary Care Doctor: - Call Health Connect at  [phone] - they can help you locate a primary care doctor that  accepts your insurance, provides certain services, etc. - Physician Referral Service- [phone]  Chronic Pain Problems: Organization         Address  Phone   Notes  [facility]  [phone] Patients need to be referred by their primary care doctor.   Medication Assistance: Organization         Address  Phone   Notes  [facility] [address] [place], [place] [address] [phone] --Must be a resident [place] -- Must have NO insurance coverage whatsoever (no Medicaid/ Medicare, etc.) -- The pt. MUST have a primary care doctor that directs their care regularly and follows them in the community   [facility]  [phone]   [employer]  [phone]    Agencies that provide inexpensive medical care: Organization         Address  Phone   Notes  [facility]  [phone]   [facility]    [phone]   [facility] [address] [place], [place] [address] [phone]   [facility] [address], [place] [phone]   [facility]    [phone]   [facility]    [phone]   [facility] ° [address], [place] Phone:  [phone], Fax:  [phone] Hours of Operation:  9 am - 6 pm, M-F.  Also accepts Medicaid/Medicare and self-pay.  °[facility] ° [address], [place] Phone: [phone], Fax: [phone]. Hours of Operation:  8:30 am - 5:30 pm, M-F.  Also accepts Medicaid and self-pay.  °[facility] [place] [address], [place] Phone: [phone]   °[facility] [address], [place], [place] [phone], Ext. [phone]  Mondays & Thursdays: 7-9 AM.  First 15 patients are seen on a first come, first serve basis. °  ° °Medicaid-accepting [place] Providers: ° °Organization         Address  Phone   Notes  °[facility] [address], [place] [phone] Also accepts self-pay patients.  °[facility] [address], [place] ° ([phone]) [phone]   °[facility] [address], [place] [phone]) [phone]   °[facility] [address] [place] [address], [place] ([phone]) [phone]   °Veita Bland [address], [place]  ° [phone]) [phone] Only accepts [place] Access Medicaid patients after they have their name applied to their card.  ° °Self-Pay (no insurance) in [place]: ° °Organization         Address  Phone   Notes  °Sickle Cell Patients, [facility] [address], [place] [phone]) [phone]   °[facility] [address], [place] ([phone]) [phone]   °[facility] [place] ° [address], [place] [phone]) [phone]   °[facility]/Dr. Osei-Bonsu ° [address] [place] [address], [place] or [address], [place] [phone]) [phone] Phone number for both [place] and [place] locations is the same.  °[facility] [address], [place] [phone]) [phone]   °[facility] [address] [place] [address], [place] or [address] (336) [phone] °(336) [phone]   °[facility] [address], [place] (336) [phone], phone; [phone], fax Sees patients 1st and 3rd Saturday of every month.  Must not qualify for public or private insurance (i.e. Medicaid, Medicare, [place] Health Choice, Veterans' Benefits) • Household income should be no more than 200% of the poverty level •The clinic cannot treat you if you are pregnant or think you are pregnant • Sexually transmitted diseases are not treated at  the clinic.  ° ° °Dental Care: °Organization         Address  Phone  Notes  °[facility] [address], [place] [phone]) [phone] Accepts children up to age 21 who are enrolled in Medicaid or [place] Health Choice; pregnant women with a Medicaid card; and children who have applied for Medicaid or [place] Health Choice, but were declined, whose parents can pay a reduced fee at time of service.  °[facility] [place]  [address], [place] (336) [phone] Accepts children up to age 21 who are enrolled in Medicaid or [place] Health Choice; pregnant women with a Medicaid card; and children who have applied for Medicaid or  Health Choice, but were declined, whose parents can pay a reduced fee at time of service.  °[facility] ° [address], [place] ([phone]) [phone] Patients are seen by appointment only. Walk-ins are not accepted. [facility] will see patients 18 years of age and older. °Monday - Tuesday (8am-5pm) °Most Wednesdays (8:30-5pm) °$30 per visit, cash only  °[facility] ° [address], [place] ([phone]) [phone] Patients are seen by appointment only. Walk-ins are not accepted. [facility] will see patients 18 years of age and older. °One Wednesday Evening (Monthly: Volunteer Based).  $30 per visit, cash only  °[facility]  ([phone]) [phone] for adults; Children under age 4, call Graduate Pediatric Dentistry at [phone]. Children aged 4-14, please call [phone] to request a pediatric application. ° Dental services are provided in all areas of dental care including fillings, crowns and bridges, complete and partial dentures, implants, gum treatment, root canals, and extractions. Preventive care is also provided. Treatment is provided to both adults and children. °Patients are selected via a lottery and there is often a  waiting list. °  °[facility] [address], °[place] ° (336) [phone] [email or web address] °  °[facility] [address], [place], [place] (336)[phone], Ext. 123 Second and Fourth Thursday of each month, opens at 6:30 AM; Clinic ends at 9 AM.  Patients are seen on a first-come first-served basis, and a limited number are seen during each clinic.  ° °[facility] ° [address], [place], [place] [phone]) [phone]   Eligibility Requirements °You must have lived in [place], [place], or [place] counties for at least the last three months. °  You cannot be eligible for state or federal sponsored healthcare insurance, including Veterans Administration, Medicaid, or Medicare. °  You generally cannot be eligible for healthcare insurance through your employer.  °  How to apply: °Eligibility screenings are held every Tuesday and Wednesday afternoon from 1:00 pm until 4:00 pm. You do not need an appointment for the interview!  °[facility] [address], [place], [place] [phone]   °[facility]  336-342-8273   °[facility]  336-703-3100   °[facility]  336-570-6415   ° °Behavioral Health Resources in the Community: °Intensive Outpatient Programs °Organization         Address  Phone  Notes  °[facility] [address], [place], [place] 336-878-6098   °[facility] [address], [place], [place] 336-832-9800   °[facility] [address], [place], [place] ° 336-882-2125   °[facility] [address],  °[place], [place] 1-800-853-5163 or 336-641-4981   °Substance Abuse Resources °Organization         Address  Phone  Notes  °[facility]  336-882-2125   °[facility]  336-784-9470   °[facility]  336-285-9073   °[facility]  336-845-3988   °Residential & Outpatient Substance Abuse  Program  1-800-659-3381   °Psychological Services °Organization         Address  Phone  Notes  °[facility]  336- 832-9600   °[facility]  336- 378-7881   °[facility] [address], [place] 1-800-853-5163 or 336-641-4981   ° °Mobile Crisis Teams °Organization         Address  Phone  Notes  °[facility], Mobile Crisis Care Unit  1-877-626-1772   °Assertive °[facility] ° [address] [place], [place] 336-834-9664   °Sharon DeEsch   [address] °[place] 336-554-5454   ° °Self-Help/Support Groups °Organization         Address  Phone             Notes  °[facility] [place] - variety of support groups  336- 373-1402 Call for more information  °Narcotics Anonymous (NA), [facility] [address], °[place]  2 meetings at this location  ° °Residential Treatment Programs °Organization         Address  Phone  Notes  °[facility] [address],    °[place]  1-866-801-8205   °[facility] ° [address], [place], [place] 704-293-8524   °[facility] [address], [place] 336-845-3988 Admissions: 8am-3pm M-F  °[facility] [address]    °[place], [place] 336-841-1104   °[facility] [address], [place], [place] 336-379-7146   °[facility] [address]  °[place], [place] 336-285-9073   °[facility] - Intensive Outpatient [address], [place], [place] 336-852-3033   °[facility] [address]  °[place], [place] 1-877-615-2722 or 336-784-9470   °[facility] ([facility]) [address] [place], [place] 336-227-7417 Accepts Medicaid  °[facility] [address]  ° [place] 1-800-659-3381 Substance Abuse/Addiction Treatment  ° °[place] Behavioral Health Resources °Organization          Address  Phone  Notes  °[facility]  (888) 581-9988   °Julie Brannon, PhD [address] [place], [place]   (336) 349-5553 or (336) 951-0000   °[facility]   [address] °[place], [place] (336) 349-4454   °[facility] [address], [place], [place] (336) 342-8316 Insurance/Medicaid/sponsorship through [facility]  °[facility] [address]                                    [place], [place] (336) 342-8316 Therapy/tele-psych/case  °[facility] [address]  ° [place], [place] (336) 349-2233    °Dr. Arfeen  (336) 349-4544   °[facility] [place]  [employer] [place] [facility] 1) [address], [place] °2) [address], [place] °3)  [address], [place] (336) 349-3220 °(336) 342-7768 ° °(336) 342-8140   °[place] [facility] Abuse [facility] (336) 342-1394 or (336) 342-3537 (After Hours)    ° ° ° °

## 2015-09-28 ENCOUNTER — Encounter (HOSPITAL_COMMUNITY): Payer: Self-pay | Admitting: Cardiology

## 2015-09-28 ENCOUNTER — Emergency Department (HOSPITAL_COMMUNITY)
Admission: EM | Admit: 2015-09-28 | Discharge: 2015-09-28 | Disposition: A | Discharge status: Home / Self Care | Payer: Self-pay | Attending: Emergency Medicine | Admitting: Emergency Medicine

## 2015-09-28 DIAGNOSIS — J069 Acute upper respiratory infection, unspecified: Secondary | ICD-10-CM | POA: Insufficient documentation

## 2015-09-28 DIAGNOSIS — Z792 Long term (current) use of antibiotics: Secondary | ICD-10-CM | POA: Insufficient documentation

## 2015-09-28 DIAGNOSIS — F17210 Nicotine dependence, cigarettes, uncomplicated: Secondary | ICD-10-CM | POA: Insufficient documentation

## 2015-09-28 DIAGNOSIS — Z791 Long term (current) use of non-steroidal anti-inflammatories (NSAID): Secondary | ICD-10-CM | POA: Insufficient documentation

## 2015-09-28 MED ORDER — GUAIFENESIN-DM 100-10 MG/5ML PO SYRP: 5.0000 mL | ORAL_SOLUTION | ORAL | Status: DC | PRN

## 2015-09-28 NOTE — ED Provider Notes (Signed)
CSN: [id number]     Arrival date & time 09/28/15  3474 History  By signing my name below, I, Norman Stewart, attest that this documentation has been prepared under the direction and in the presence of Teressa Lower, NP Electronically Signed: Soijett Stewart, ED Scribe. 09/28/2015. 7:02 PM.   Chief Complaint  Patient presents with  . Cough      The history is provided by the patient. No language interpreter was used.    Norman Stewart is a 24 y.o. male who presents to the Emergency Department complaining of cough onset 1 week. He reports that he has been unable to sleep due to the cold. He states that he has tried tylenol and theraflu with no relief for his symptoms. He denies fever, chills, color change, rash, wound, and any other symptoms. Denies PMHx or allergies to medications. He notes that he is a current smoker but he hasn't smoked in the past 5 months.   History reviewed. No pertinent past medical history. History reviewed. No pertinent past surgical history. History reviewed. No pertinent family history. Social History  Substance Use Topics  . Smoking status: Current Every Day Smoker -- 0.50 packs/day    Types: Cigarettes  . Smokeless tobacco: None  . Alcohol Use: Yes     Comment: weekends    Review of Systems  Constitutional: Negative for fever and chills.  Respiratory: Positive for cough.   Skin: Negative for color change, rash and wound.      Allergies  Review of patient's allergies indicates no known allergies.  Home Medications   Prior to Admission medications   Medication Sig Start Date End Date Taking? Authorizing Provider  amoxicillin (AMOXIL) 500 MG capsule Take 2 capsules (1,000 mg total) by mouth 2 (two) times daily. 05/09/14   Dione Booze, MD  clindamycin (CLEOCIN) 150 MG capsule Take 2 capsules (300 mg total) by mouth 3 (three) times daily. May dispense as  capsules 12/10/14   Hannah Muthersbaugh, PA-C  HYDROcodone-acetaminophen (NORCO/VICODIN)  5-325 MG per tablet Take 1-2 tablets by mouth every 6 (six) hours as needed for moderate pain or severe pain. 12/10/14   Hannah Muthersbaugh, PA-C  ibuprofen (ADVIL,MOTRIN) 800 MG tablet Take 1 tablet (800 mg total) by mouth every 8 (eight) hours as needed for mild pain or moderate pain. 11/23/14   Trixie Dredge, PA-C  lidocaine (XYLOCAINE) 2 % solution Use as directed 20 mLs in the mouth or throat as needed for mouth pain. 10/07/14   Heather Laisure, PA-C  naproxen (NAPROSYN) 500 MG tablet Take 1 tablet (500 mg total) by mouth 2 (two) times daily. 10/07/14   Heather Laisure, PA-C  traMADol (ULTRAM) 50 MG tablet Take 1 tablet (50 mg total) by mouth every 6 (six) hours as needed. 05/09/14   Dione Booze, MD   BP 134/82 mmHg  Pulse 89  Temp(Src) 98 F (36.7 C) (Oral)  Resp 18  Ht  (1.651 m)  Wt 160 lb (72.576 kg)  BMI 26.63 kg/m2  SpO2 98% Physical Exam  Constitutional: He is oriented to person, place, and time. He appears well-developed and well-nourished. No distress.  HENT:  Head: Normocephalic and atraumatic.  Right Ear: Tympanic membrane, external ear and ear canal normal.  Left Ear: Tympanic membrane, external ear and ear canal normal.  Mouth/Throat: Uvula is midline, oropharynx is clear and moist and mucous membranes are normal.  Eyes: EOM are normal.  Neck: Neck supple.  Cardiovascular: Normal rate, regular rhythm and normal heart sounds.  Exam reveals no gallop and no friction rub.   No murmur heard. Pulmonary/Chest: Effort normal and breath sounds normal. No respiratory distress. He has no wheezes. He has no rales.  Musculoskeletal: Normal range of motion.  Neurological: He is alert and oriented to person, place, and time.  Skin: Skin is warm and dry.  Psychiatric: He has a normal mood and affect. His behavior is normal.  Nursing note and vitals reviewed.   ED Course  Procedures (including critical care time) DIAGNOSTIC STUDIES: Oxygen Saturation is 98% on RA, nl by my  interpretation.    COORDINATION OF CARE: 7:01 PM Discussed treatment plan with pt at bedside which includes cough syrup Rx and pt agreed to plan.    Labs Review Labs Reviewed - No data to display  Imaging Review No results found.    EKG Interpretation None      MDM   Final diagnoses:  URI (upper respiratory infection)    Don't think imaging is needed at this time. Discussed otc medications for symptomatic relief  I personally performed the services described in this documentation, which was scribed in my presence. The recorded information has been reviewed and is accurate.    Teressa LowerVrinda Meela Wareing, NP 09/28/15 2015  Cathren LaineKevin Steinl, MD 09/29/15 1520

## 2015-09-28 NOTE — Discharge Instructions (Signed)
Upper Respiratory Infection, Adult Most upper respiratory infections (URIs) are a viral infection of the air passages leading to the lungs. A URI affects the nose, throat, and upper air passages. The most common type of URI is nasopharyngitis and is typically referred to as "the common cold." URIs run their course and usually go away on their own. Most of the time, a URI does not require medical attention, but sometimes a bacterial infection in the upper airways can follow a viral infection. This is called a secondary infection. Sinus and middle ear infections are common types of secondary upper respiratory infections. Bacterial pneumonia can also complicate a URI. A URI can worsen asthma and chronic obstructive pulmonary disease (COPD). Sometimes, these complications can require emergency medical care and may be life threatening.  CAUSES Almost all URIs are caused by viruses. A virus is a type of germ and can spread from one person to another.  RISKS FACTORS You may be at risk for a URI if:   You smoke.   You have chronic heart or lung disease.  You have a weakened defense (immune) system.   You are very young or very old.   You have nasal allergies or asthma.  You work in crowded or poorly ventilated areas.  You work in health care facilities or schools. SIGNS AND SYMPTOMS  Symptoms typically develop 2-3 days after you come in contact with a cold virus. Most viral URIs last 7-10 days. However, viral URIs from the influenza virus (flu virus) can last 14-18 days and are typically more severe. Symptoms may include:   Runny or stuffy (congested) nose.   Sneezing.   Cough.   Sore throat.   Headache.   Fatigue.   Fever.   Loss of appetite.   Pain in your forehead, behind your eyes, and over your cheekbones (sinus pain).  Muscle aches.  DIAGNOSIS  Your health care provider may diagnose a URI by:  Physical exam.  Tests to check that your symptoms are not due to  another condition such as:  Strep throat.  Sinusitis.  Pneumonia.  Asthma. TREATMENT  A URI goes away on its own with time. It cannot be cured with medicines, but medicines may be prescribed or recommended to relieve symptoms. Medicines may help:  Reduce your fever.  Reduce your cough.  Relieve nasal congestion. HOME CARE INSTRUCTIONS   Take medicines only as directed by your health care provider.   Gargle warm saltwater or take cough drops to comfort your throat as directed by your health care provider.  Use a warm mist humidifier or inhale steam from a shower to increase air moisture. This may make it easier to breathe.  Drink enough fluid to keep your urine clear or pale yellow.   Eat soups and other clear broths and maintain good nutrition.   Rest as needed.   Return to work when your temperature has returned to normal or as your health care provider advises. You may need to stay home longer to avoid infecting others. You can also use a face mask and careful hand washing to prevent spread of the virus.  Increase the usage of your inhaler if you have asthma.   Do not use any tobacco products, including cigarettes, chewing tobacco, or electronic cigarettes. If you need help quitting, ask your health care provider. PREVENTION  The best way to protect yourself from getting a cold is to practice good hygiene.   Avoid oral or hand contact with people with cold   symptoms.   Wash your hands often if contact occurs.  There is no clear evidence that vitamin C, vitamin E, echinacea, or exercise reduces the chance of developing a cold. However, it is always recommended to get plenty of rest, exercise, and practice good nutrition.  SEEK MEDICAL CARE IF:   You are getting worse rather than better.   Your symptoms are not controlled by medicine.   You have chills.  You have worsening shortness of breath.  You have brown or red mucus.  You have yellow or brown nasal  discharge.  You have pain in your face, especially when you bend forward.  You have a fever.  You have swollen neck glands.  You have pain while swallowing.  You have white areas in the back of your throat. SEEK IMMEDIATE MEDICAL CARE IF:   You have severe or persistent:  Headache.  Ear pain.  Sinus pain.  Chest pain.  You have chronic lung disease and any of the following:  Wheezing.  Prolonged cough.  Coughing up blood.  A change in your usual mucus.  You have a stiff neck.  You have changes in your:  Vision.  Hearing.  Thinking.  Mood. MAKE SURE YOU:   Understand these instructions.  Will watch your condition.  Will get help right away if you are not doing well or get worse.   This information is not intended to replace advice given to you by your health care provider. Make sure you discuss any questions you have with your health care provider.   Document Released: 03/22/2001 Document Revised: 02/10/2015 Document Reviewed: 01/01/2014 Elsevier Interactive Patient Education 2016 [employer]  

## 2015-09-28 NOTE — ED Notes (Signed)
Reports a cough for the past week and pressure across the front of his forehead. States that he has been unable to sleep at night and the coughing is worse.

## 2017-03-17 ENCOUNTER — Emergency Department (HOSPITAL_COMMUNITY)
Admission: EM | Admit: 2017-03-17 | Discharge: 2017-03-17 | Disposition: A | Discharge status: Home / Self Care | Payer: Self-pay | Attending: Emergency Medicine | Admitting: Emergency Medicine

## 2017-03-17 ENCOUNTER — Encounter (HOSPITAL_COMMUNITY): Payer: Self-pay | Admitting: *Deleted

## 2017-03-17 DIAGNOSIS — F17210 Nicotine dependence, cigarettes, uncomplicated: Secondary | ICD-10-CM | POA: Insufficient documentation

## 2017-03-17 DIAGNOSIS — K0889 Other specified disorders of teeth and supporting structures: Secondary | ICD-10-CM | POA: Insufficient documentation

## 2017-03-17 DIAGNOSIS — Z79899 Other long term (current) drug therapy: Secondary | ICD-10-CM | POA: Insufficient documentation

## 2017-03-17 MED ORDER — NAPROXEN 500 MG PO TABS
500.0000 mg | ORAL_TABLET | Freq: Once | ORAL | Status: AC
Start: 2017-03-17 — End: 2017-03-17
  Administered 2017-03-17: 500 mg via ORAL
  Filled 2017-03-17: qty 1

## 2017-03-17 MED ORDER — NAPROXEN 500 MG PO TABS: 500.0000 mg | ORAL_TABLET | Freq: Two times a day (BID) | ORAL | 0 refills | Status: DC

## 2017-03-17 MED ORDER — AMOXICILLIN-POT CLAVULANATE 875-125 MG PO TABS: 1.0000 | ORAL_TABLET | Freq: Two times a day (BID) | ORAL | 0 refills | Status: DC

## 2017-03-17 NOTE — Discharge Instructions (Signed)
Take Augmentin twice daily for 7 days. Take naproxen or Tylenol as needed for pain. Follow-up with dentist listed below for further evaluation as soon as possible. Return to ED for worsening pain, drainage, bleeding, trouble breathing, trouble swallowing, drooling.

## 2017-03-17 NOTE — ED Provider Notes (Signed)
[facility]-EMERGENCY DEPT Provider Note   CSN: [id number] Arrival date & time: 03/17/17  1107  By signing my name below, I, Rosario Adie, attest that this documentation has been prepared under the direction and in the presence of Phinehas Grounds, PA-C.  Electronically Signed: Rosario Adie, ED Scribe. 03/17/17. 12:05 PM.  History   Chief Complaint Chief Complaint  Patient presents with  . Dental Pain   The history is provided by the patient. No language interpreter was used.    HPI Comments: Norman Stewart is a 26 y.o. male who presents to the Emergency Department complaining of persistent, right-sided, upper dental pain beginning yesterday. He reports some radiation into his right ear. Pt describes their pain as throbbing. Per pt, an tooth to the area of his pain is currently fractured but he has not previously had significant dental repair to the area. Pt has been taking Tylenol at home with minimal relief of his pain. Pt's pain is exacerbated with chewing. They are currently followed by a dental specialist who he last saw 3-4 months ago. Pt denies nausea, vomiting, fever, chills, sore throat, trouble swallowing, drooling, shortness of breath, anorexia, ear drainage, chest pain, rashes, neck pain, trouble moving neck or any other associated symptoms.   History reviewed. No pertinent past medical history.  There are no active problems to display for this patient.  History reviewed. No pertinent surgical history.  Home Medications    Prior to Admission medications   Medication Sig Start Date End Date Taking? Authorizing Provider  amoxicillin (AMOXIL) 500 MG capsule Take 2 capsules (1,000 mg total) by mouth 2 (two) times daily. 05/09/14   Dione Booze, Norman  amoxicillin-clavulanate (AUGMENTIN) 875-125 MG tablet Take 1 tablet by mouth 2 (two) times daily. 03/17/17   Ashutosh Dieguez, PA-C  clindamycin (CLEOCIN) 150 MG capsule Take 2 capsules (300 mg total) by mouth 3 (three) times  daily. May dispense as 150mg  capsules 12/10/14   Muthersbaugh, Dahlia Client, PA-C  guaiFENesin-dextromethorphan (ROBITUSSIN DM) 100-10 MG/5ML syrup Take 5 mLs by mouth every 4 (four) hours as needed for cough. 09/28/15   Teressa Lower, NP  HYDROcodone-acetaminophen (NORCO/VICODIN) 5-325 MG per tablet Take 1-2 tablets by mouth every 6 (six) hours as needed for moderate pain or severe pain. 12/10/14   Muthersbaugh, Dahlia Client, PA-C  ibuprofen (ADVIL,MOTRIN) 800 MG tablet Take 1 tablet (800 mg total) by mouth every 8 (eight) hours as needed for mild pain or moderate pain. 11/23/14   Trixie Dredge, PA-C  lidocaine (XYLOCAINE) 2 % solution Use as directed 20 mLs in the mouth or throat as needed for mouth pain. 10/07/14   Santiago Glad, PA-C  naproxen (NAPROSYN) 500 MG tablet Take 1 tablet (500 mg total) by mouth 2 (two) times daily. 03/17/17   Kathlean Cinco, PA-C  traMADol (ULTRAM) 50 MG tablet Take 1 tablet (50 mg total) by mouth every 6 (six) hours as needed. 05/09/14   Dione Booze, Norman   Family History No family history on file.  Social History Social History  Substance Use Topics  . Smoking status: Current Every Day Smoker    Packs/day: 0.50    Types: Cigarettes  . Smokeless tobacco: Never Used  . Alcohol use Yes     Comment: weekends   Allergies   Patient has no known allergies.  Review of Systems Review of Systems  Constitutional: Negative for appetite change, chills and fever.  HENT: Positive for dental problem and ear pain (right, 2/t radiation). Negative for drooling, sore throat and  trouble swallowing.   Respiratory: Negative for shortness of breath.   Cardiovascular: Negative for chest pain.  Gastrointestinal: Negative for nausea and vomiting.  Musculoskeletal: Negative for neck pain and neck stiffness.  Neurological: Negative for numbness and headaches.   Physical Exam Updated Vital Signs BP (!) 156/96 (BP Location: Left Arm)   Pulse 64   Temp 98.3 F (36.8 C) (Oral)   Resp 16    SpO2 99%   Physical Exam  Constitutional: He appears well-developed and well-nourished. No distress.  HENT:  Head: Normocephalic and atraumatic.  Mouth/Throat: Uvula is midline and oropharynx is clear and moist.    No facial swelling. Pt tolerating secretions and does not appear in respiratory distress. No throat swelling. No trismus, no neck swelling, facial swelling, neck stiffness or trouble moving neck. No rashes noted. No evidence of infection into deeper tissues of neck. No visible abscess or site of drainage visualized at the gumline.  Eyes: Conjunctivae and EOM are normal. No scleral icterus.  Neck: Normal range of motion.  Cardiovascular: Normal rate and regular rhythm.   Pulmonary/Chest: Effort normal and breath sounds normal. No respiratory distress.  Neurological: He is alert.  Skin: No rash noted. He is not diaphoretic.  Psychiatric: He has a normal mood and affect.  Nursing note and vitals reviewed.  ED Treatments / Results  DIAGNOSTIC STUDIES: Oxygen Saturation is 99% on RA, normal by my interpretation.   COORDINATION OF CARE: 12:05 PM-Discussed next steps with pt. Pt verbalized understanding and is agreeable with the plan.   Labs (all labs ordered are listed, but only abnormal results are displayed) Labs Reviewed - No data to display  EKG  EKG Interpretation None      Radiology No results found.  Procedures Procedures  Medications Ordered in ED Medications  naproxen (NAPROSYN) tablet 500 mg (500 mg Oral Given 03/17/17 1234)    Initial Impression / Assessment and Plan / ED Course  I have reviewed the triage vital signs and the nursing notes.  Pertinent labs & imaging results that were available during my care of the patient were reviewed by me and considered in my medical decision making (see chart for details).     Dental pain associated with dental fx but no signs or symptoms of dental abscess with patient afebrile with no history of fever, non  toxic appearing and swallowing secretions well.  Exam unconcerning for Ludwig's angina or other deep tissue infection in neck. No neck pain, neck stiffness, facial swelling or trismus. No throat swelling. Will prescribed Naproxen and course of Augmentin. Patient given naproxen here in the ED. I stressed the importance of dental follow up for ultimate management of dental pain. Patient voices understanding and is agreeable to plan. Strict return precautions were discussed.   Final Clinical Impressions(s) / ED Diagnoses   Final diagnoses:  Pain, dental   New Prescriptions Discharge Medication List as of 03/17/2017 12:20 PM    START taking these medications   Details  amoxicillin-clavulanate (AUGMENTIN) 875-125 MG tablet Take 1 tablet by mouth 2 (two) times daily., Starting Fri 03/17/2017, Print       I personally performed the services described in this documentation, which was scribed in my presence. The recorded information has been reviewed and is accurate.     Dietrich PatesKhatri, Catalina Salasar, PA-C 03/17/17 1326    Little, Ambrose Finlandachel Morgan, Norman 03/17/17 1816

## 2017-03-17 NOTE — ED Triage Notes (Signed)
Pt reports dental pain that began yesterday.  Pt has been taken tylenol with no relief.  Pt took some left over penicillin pills too. Pt has a missing tooth where pt is reporting pain and the side of his mouth is swollen.

## 2017-09-05 ENCOUNTER — Encounter (HOSPITAL_COMMUNITY): Payer: Self-pay | Admitting: *Deleted

## 2017-09-05 ENCOUNTER — Other Ambulatory Visit: Payer: Self-pay

## 2017-09-05 DIAGNOSIS — R110 Nausea: Secondary | ICD-10-CM | POA: Insufficient documentation

## 2017-09-05 DIAGNOSIS — Z5321 Procedure and treatment not carried out due to patient leaving prior to being seen by health care provider: Secondary | ICD-10-CM | POA: Insufficient documentation

## 2017-09-05 LAB — URINALYSIS, ROUTINE W REFLEX MICROSCOPIC
BILIRUBIN URINE: NEGATIVE
Glucose, UA: NEGATIVE mg/dL
Ketones, ur: NEGATIVE mg/dL
LEUKOCYTES UA: NEGATIVE
Nitrite: NEGATIVE
Protein, ur: NEGATIVE mg/dL
SPECIFIC GRAVITY, URINE: 1.027 (ref 1.005–1.030)
pH: 5 (ref 5.0–8.0)

## 2017-09-05 LAB — CBC
HEMATOCRIT: 48.6 % (ref 39.0–52.0)
Hemoglobin: 17.1 g/dL — ABNORMAL HIGH (ref 13.0–17.0)
MCH: 29.8 pg (ref 26.0–34.0)
MCHC: 35.2 g/dL (ref 30.0–36.0)
MCV: 84.7 fL (ref 78.0–100.0)
Platelets: 166 10*3/uL (ref 150–400)
RBC: 5.74 MIL/uL (ref 4.22–5.81)
RDW: 14.2 % (ref 11.5–15.5)
WBC: 6.7 10*3/uL (ref 4.0–10.5)

## 2017-09-05 LAB — COMPREHENSIVE METABOLIC PANEL
ALBUMIN: 4.1 g/dL (ref 3.5–5.0)
ALT: 30 U/L (ref 17–63)
AST: 25 U/L (ref 15–41)
Alkaline Phosphatase: 70 U/L (ref 38–126)
Anion gap: 7 (ref 5–15)
BUN: 7 mg/dL (ref 6–20)
CHLORIDE: 107 mmol/L (ref 101–111)
CO2: 23 mmol/L (ref 22–32)
Calcium: 10.1 mg/dL (ref 8.9–10.3)
Creatinine, Ser: 1.34 mg/dL — ABNORMAL HIGH (ref 0.61–1.24)
GFR calc Af Amer: >60 mL/min (ref >60)
GFR calc non Af Amer: >60 mL/min (ref >60)
GLUCOSE: 85 mg/dL (ref 65–99)
POTASSIUM: 4.2 mmol/L (ref 3.5–5.1)
Sodium: 137 mmol/L (ref 135–145)
Total Bilirubin: 1 mg/dL (ref 0.3–1.2)
Total Protein: 7.8 g/dL (ref 6.5–8.1)

## 2017-09-05 LAB — LIPASE, BLOOD: LIPASE: 35 U/L (ref 11–51)

## 2017-09-05 NOTE — ED Triage Notes (Signed)
Pt in c/o nausea and diarrhea onset x 4 days, denies loose stools in the last 24 hrs, c/o generalized abd pain, A&O x4

## 2017-09-06 ENCOUNTER — Emergency Department (HOSPITAL_COMMUNITY)
Admission: EM | Admit: 2017-09-06 | Discharge: 2017-09-06 | Disposition: A | Discharge status: Home / Self Care | Payer: Self-pay | Attending: Emergency Medicine | Admitting: Emergency Medicine

## 2017-09-06 NOTE — ED Notes (Signed)
Called for vitals, no answer 

## 2017-09-06 NOTE — ED Notes (Signed)
Pt observed leaving ED 

## 2018-08-09 ENCOUNTER — Emergency Department (HOSPITAL_COMMUNITY)
Admission: EM | Admit: 2018-08-09 | Discharge: 2018-08-10 | Disposition: A | Discharge status: Home / Self Care | Payer: Self-pay | Attending: Emergency Medicine | Admitting: Emergency Medicine

## 2018-08-09 ENCOUNTER — Other Ambulatory Visit: Payer: Self-pay

## 2018-08-09 DIAGNOSIS — F17210 Nicotine dependence, cigarettes, uncomplicated: Secondary | ICD-10-CM | POA: Insufficient documentation

## 2018-08-09 DIAGNOSIS — B349 Viral infection, unspecified: Secondary | ICD-10-CM | POA: Insufficient documentation

## 2018-08-09 LAB — GROUP A STREP BY PCR: GROUP A STREP BY PCR: NOT DETECTED

## 2018-08-09 MED ORDER — PROMETHAZINE HCL 25 MG PO TABS: 25.0000 mg | ORAL_TABLET | Freq: Four times a day (QID) | ORAL | 0 refills | Status: DC | PRN

## 2018-08-09 MED ORDER — IBUPROFEN 800 MG PO TABS
800.0000 mg | ORAL_TABLET | Freq: Once | ORAL | Status: AC
  Administered 2018-08-10: 800 mg via ORAL
  Filled 2018-08-09: qty 1

## 2018-08-09 MED ORDER — ONDANSETRON 4 MG PO TBDP
4.0000 mg | ORAL_TABLET | Freq: Once | ORAL | Status: AC
  Administered 2018-08-10: 4 mg via ORAL
  Filled 2018-08-09: qty 1

## 2018-08-09 NOTE — ED Provider Notes (Signed)
TIME SEEN: 11:31 PM  CHIEF COMPLAINT: Fever, chills, body aches, sore throat  HPI: Patient is a 27 year old male with no significant past medical history who presents to the emergency department with subjective fevers, chills, sore throat, body aches that started this evening.  He initially thought that he could have been bitten by a spider because he saw a spider crawling on his shoulder.  Does not remember a bug bite does not have any skin lesions.  Has not yet had a flu vaccination this year.  No vomiting or diarrhea.  States he feels weak all over.  Did not take any medications prior to arrival.  ROS: See HPI Constitutional: Subjective fever  Eyes: no drainage  ENT: no runny nose   Cardiovascular:  no chest pain  Resp: no SOB  GI: no vomiting GU: no dysuria Integumentary: no rash  Allergy: no hives  Musculoskeletal: no leg swelling  Neurological: no slurred speech ROS otherwise negative  PAST MEDICAL HISTORY/PAST SURGICAL HISTORY:  No past medical history on file.  MEDICATIONS:  Prior to Admission medications   Medication Sig Start Date End Date Taking? Authorizing Provider  amoxicillin (AMOXIL) 500 MG capsule Take 2 capsules (1,000 mg total) by mouth 2 (two) times daily. 05/09/14   Dione Booze, MD  amoxicillin-clavulanate (AUGMENTIN) 875-125 MG tablet Take 1 tablet by mouth 2 (two) times daily. 03/17/17   Khatri, Hina, PA-C  clindamycin (CLEOCIN) 150 MG capsule Take 2 capsules (300 mg total) by mouth 3 (three) times daily. May dispense as 150mg  capsules 12/10/14   Muthersbaugh, Dahlia Client, PA-C  guaiFENesin-dextromethorphan (ROBITUSSIN DM) 100-10 MG/5ML syrup Take 5 mLs by mouth every 4 (four) hours as needed for cough. 09/28/15   Teressa Lower, NP  HYDROcodone-acetaminophen (NORCO/VICODIN) 5-325 MG per tablet Take 1-2 tablets by mouth every 6 (six) hours as needed for moderate pain or severe pain. 12/10/14   Muthersbaugh, Dahlia Client, PA-C  ibuprofen (ADVIL,MOTRIN) 800 MG tablet Take 1  tablet (800 mg total) by mouth every 8 (eight) hours as needed for mild pain or moderate pain. 11/23/14   Trixie Dredge, PA-C  lidocaine (XYLOCAINE) 2 % solution Use as directed 20 mLs in the mouth or throat as needed for mouth pain. 10/07/14   Santiago Glad, PA-C  naproxen (NAPROSYN) 500 MG tablet Take 1 tablet (500 mg total) by mouth 2 (two) times daily. 03/17/17   Khatri, Hina, PA-C  traMADol (ULTRAM) 50 MG tablet Take 1 tablet (50 mg total) by mouth every 6 (six) hours as needed. 05/09/14   Dione Booze, MD    ALLERGIES:  No Known Allergies  SOCIAL HISTORY:  Social History   Tobacco Use  . Smoking status: Current Every Day Smoker    Packs/day: 0.50    Types: Cigarettes  . Smokeless tobacco: Never Used  Substance Use Topics  . Alcohol use: Yes    Comment: weekends    FAMILY HISTORY: No family history on file.  EXAM: BP (!) 139/96 (BP Location: Left Arm)   Pulse 86   Temp 98 F (36.7 C) (Oral)   Resp 18   Ht 5\' 6"  (1.676 m)   Wt 90.7 kg   SpO2 99%   BMI 32.28 kg/m  CONSTITUTIONAL: Alert and oriented and responds appropriately to questions. Well-appearing; well-nourished HEAD: Normocephalic EYES: Conjunctivae clear, pupils appear equal, EOMI ENT: normal nose; moist mucous membranes; she has some posterior pharyngeal erythema on examination, no tonsillar hypertrophy or exudate, no uvular deviation, no unilateral swelling, no trismus or drooling, no muffled voice,  normal phonation, no stridor, no dental caries present, no drainable dental abscess noted, no Ludwig's angina, tongue sits flat in the bottom of the mouth, no angioedema, no facial erythema or warmth, no facial swelling; no pain with movement of the neck. NECK: Supple, no meningismus, no nuchal rigidity, no LAD  CARD: RRR; S1 and S2 appreciated; no murmurs, no clicks, no rubs, no gallops RESP: Normal chest excursion without splinting or tachypnea; breath sounds clear and equal bilaterally; no wheezes, no rhonchi, no  rales, no hypoxia or respiratory distress, speaking full sentences ABD/GI: Normal bowel sounds; non-distended; soft, non-tender, no rebound, no guarding, no peritoneal signs, no hepatosplenomegaly BACK:  The back appears normal and is non-tender to palpation, there is no CVA tenderness EXT: Normal ROM in all joints; non-tender to palpation; no edema; normal capillary refill; no cyanosis, no calf tenderness or swelling    SKIN: Normal color for age and race; warm; no rash, warm to touch NEURO: Moves all extremities equally PSYCH: The patient's mood and manner are appropriate. Grooming and personal hygiene are appropriate.  MEDICAL DECISION MAKING: Patient here with viral illness, possible influenza.  Discussed with patient that we do not do routine flu testing from the emergency department.  Strep test is negative.  Nothing at this time to suggest meningitis, pneumonia, bacteremia, sepsis.  No signs of PTA, deep space neck infection.  He is well-appearing, well-nourished, hemodynamically stable.  He is not interested in Tamiflu at this time given he does not have insurance.  We will treat symptomatically with ibuprofen, Zofran.  Discharged with instructions for symptomatic relief at home with over-the-counter medications and will discharge with Phenergan.   At this time, I do not feel there is any life-threatening condition present. I have reviewed and discussed all results (EKG, imaging, lab, urine as appropriate) and exam findings with patient/family. I have reviewed nursing notes and appropriate previous records.  I feel the patient is safe to be discharged home without further emergent workup and can continue workup as an outpatient as needed. Discussed usual and customary return precautions. Patient/family verbalize understanding and are comfortable with this plan.  Outpatient follow-up has been provided if needed. All questions have been answered.      Jeral Zick, Layla Maw, DO 08/10/18 0000

## 2018-08-09 NOTE — Discharge Instructions (Addendum)
You may alternate Tylenol 1000 mg every 6 hours as needed for pain and fever and Ibuprofen 800 mg every 8 hours as needed for pain and fever.  Please take Ibuprofen with food. ° °

## 2018-08-09 NOTE — ED Triage Notes (Signed)
Pt reports sore throat, body aches, chills, and weakness. Pt reports not feeling well all day.

## 2018-08-23 ENCOUNTER — Other Ambulatory Visit: Payer: Self-pay

## 2018-08-23 ENCOUNTER — Encounter (HOSPITAL_COMMUNITY): Payer: Self-pay | Admitting: *Deleted

## 2018-08-23 ENCOUNTER — Emergency Department (HOSPITAL_COMMUNITY)
Admission: EM | Admit: 2018-08-23 | Discharge: 2018-08-23 | Disposition: A | Discharge status: Home / Self Care | Payer: Self-pay | Attending: Emergency Medicine | Admitting: Emergency Medicine

## 2018-08-23 DIAGNOSIS — F17210 Nicotine dependence, cigarettes, uncomplicated: Secondary | ICD-10-CM | POA: Insufficient documentation

## 2018-08-23 DIAGNOSIS — R51 Headache: Secondary | ICD-10-CM | POA: Insufficient documentation

## 2018-08-23 DIAGNOSIS — R519 Headache, unspecified: Secondary | ICD-10-CM

## 2018-08-23 HISTORY — DX: Headache: R51

## 2018-08-23 HISTORY — DX: Headache, unspecified: R51.9

## 2018-08-23 MED ORDER — KETOROLAC TROMETHAMINE 30 MG/ML IJ SOLN
30.0000 mg | Freq: Once | INTRAMUSCULAR | Status: AC
  Administered 2018-08-23: 30 mg via INTRAVENOUS
  Filled 2018-08-23: qty 1

## 2018-08-23 MED ORDER — PROCHLORPERAZINE EDISYLATE 10 MG/2ML IJ SOLN
10.0000 mg | Freq: Once | INTRAMUSCULAR | Status: AC
  Administered 2018-08-23: 10 mg via INTRAVENOUS
  Filled 2018-08-23: qty 2

## 2018-08-23 MED ORDER — DIPHENHYDRAMINE HCL 50 MG/ML IJ SOLN
25.0000 mg | Freq: Once | INTRAMUSCULAR | Status: AC
  Administered 2018-08-23: 25 mg via INTRAVENOUS
  Filled 2018-08-23: qty 1

## 2018-08-23 MED ORDER — DEXAMETHASONE SODIUM PHOSPHATE 10 MG/ML IJ SOLN
10.0000 mg | Freq: Once | INTRAMUSCULAR | Status: AC
  Administered 2018-08-23: 10 mg via INTRAVENOUS
  Filled 2018-08-23: qty 1

## 2018-08-23 MED ORDER — SODIUM CHLORIDE 0.9 % IV BOLUS
1000.0000 mL | Freq: Once | INTRAVENOUS | Status: AC
  Administered 2018-08-23: 1000 mL via INTRAVENOUS

## 2018-08-23 NOTE — ED Triage Notes (Signed)
Pt reports Ha or 2 days on RT side o head. Pt denies visual changes. Pt denies N/V/D. Pt reports he has had HA before.

## 2018-08-23 NOTE — ED Provider Notes (Signed)
[facility] EMERGENCY DEPARTMENT Provider Note   CSN: [id number] Arrival date & time: 08/23/18  0751     History   Chief Complaint Chief Complaint  Patient presents with  . Headache    HPI Norman Stewart is a 27 y.o. male.  Norman Stewart is a 27 y.o. Male with a history of headaches, who presents to the emergency department for evaluation of headache that started 2 days ago, its present over the right side of his head and is a dull constant ache.  He reports light sensitivity but no associated visual changes, no nausea, or vomiting.  He denies any dizziness.  No numbness tingling or weakness in the extremities.  No facial asymmetry or changes in speech or swallowing.  He denies any fevers or neck pain or stiffness.  Headache was gradual in onset.  History of similar headaches in the past, has never been evaluated for these but has strong family history of migraines.  This was not the worst headache of his life.  It improved yesterday with ibuprofen but then came back.      Past Medical History:  Diagnosis Date  . Head ache     There are no active problems to display for this patient.   History reviewed. No pertinent surgical history.      Home Medications    Prior to Admission medications   Medication Sig Start Date End Date Taking? Authorizing Provider  amoxicillin (AMOXIL) 500 MG capsule Take 2 capsules (1,000 mg total) by mouth 2 (two) times daily. 05/09/14   Dione Booze, MD  amoxicillin-clavulanate (AUGMENTIN) 875-125 MG tablet Take 1 tablet by mouth 2 (two) times daily. 03/17/17   Khatri, Hina, PA-C  clindamycin (CLEOCIN) 150 MG capsule Take 2 capsules (300 mg total) by mouth 3 (three) times daily. May dispense as 150mg  capsules 12/10/14   Muthersbaugh, Dahlia Client, PA-C  guaiFENesin-dextromethorphan (ROBITUSSIN DM) 100-10 MG/5ML syrup Take 5 mLs by mouth every 4 (four) hours as needed for cough. 09/28/15   Teressa Lower, NP    HYDROcodone-acetaminophen (NORCO/VICODIN) 5-325 MG per tablet Take 1-2 tablets by mouth every 6 (six) hours as needed for moderate pain or severe pain. 12/10/14   Muthersbaugh, Dahlia Client, PA-C  ibuprofen (ADVIL,MOTRIN) 800 MG tablet Take 1 tablet (800 mg total) by mouth every 8 (eight) hours as needed for mild pain or moderate pain. 11/23/14   Trixie Dredge, PA-C  lidocaine (XYLOCAINE) 2 % solution Use as directed 20 mLs in the mouth or throat as needed for mouth pain. 10/07/14   Santiago Glad, PA-C  naproxen (NAPROSYN) 500 MG tablet Take 1 tablet (500 mg total) by mouth 2 (two) times daily. 03/17/17   Khatri, Hina, PA-C  promethazine (PHENERGAN) 25 MG tablet Take 1 tablet (25 mg total) by mouth every 6 (six) hours as needed for nausea or vomiting. 08/09/18   Ward, Layla Maw, DO  traMADol (ULTRAM) 50 MG tablet Take 1 tablet (50 mg total) by mouth every 6 (six) hours as needed. 05/09/14   Dione Booze, MD    Family History History reviewed. No pertinent family history.  Social History Social History   Tobacco Use  . Smoking status: Current Every Day Smoker    Packs/day: 0.50    Types: Cigarettes  . Smokeless tobacco: Never Used  Substance Use Topics  . Alcohol use: Yes    Comment: weekends  . Drug use: No     Allergies   Patient has no known allergies.   Review  of Systems Review of Systems  Constitutional: Negative for chills and fever.  HENT: Negative for congestion, ear pain, rhinorrhea, sore throat and tinnitus.   Eyes: Negative for visual disturbance.  Respiratory: Negative for cough and shortness of breath.   Cardiovascular: Negative for chest pain.  Gastrointestinal: Negative for abdominal pain, nausea and vomiting.  Musculoskeletal: Negative for neck pain and neck stiffness.  Skin: Negative for color change and rash.  Neurological: Positive for headaches. Negative for dizziness, tremors, seizures, syncope, facial asymmetry, speech difficulty, weakness, light-headedness and  numbness.  All other systems reviewed and are negative.    Physical Exam Updated Vital Signs BP (!) 138/91 (BP Location: Right Arm)   Pulse 70   Temp 98.5 F (36.9 C) (Oral)   Resp 16   Ht 5\' 6"  (1.676 m)   Wt 90.8 kg   SpO2 100%   BMI 32.31 kg/m   Physical Exam  Constitutional: He is oriented to person, place, and time. He appears well-developed and well-nourished. No distress.  HENT:  Head: Normocephalic and atraumatic.  Mouth/Throat: Oropharynx is clear and moist.  Eyes: Right eye exhibits no discharge. Left eye exhibits no discharge.  Neck: Neck supple. No neck rigidity. No Brudzinski's sign and no Kernig's sign noted.  Cardiovascular: Normal rate, regular rhythm, normal heart sounds and intact distal pulses.  Pulmonary/Chest: Effort normal and breath sounds normal. No respiratory distress.  Respirations equal and unlabored, patient able to speak in full sentences, lungs clear to auscultation bilaterally  Abdominal: Soft. Bowel sounds are normal. He exhibits no distension and no mass. There is no tenderness. There is no guarding.  Abdomen soft, nondistended, nontender to palpation in all quadrants without guarding or peritoneal signs  Neurological: He is alert and oriented to person, place, and time. Coordination normal.  Speech is clear, able to follow commands CN III-XII intact Normal strength in upper and lower extremities bilaterally including dorsiflexion and plantar flexion, strong and equal grip strength Sensation normal to light and sharp touch Moves extremities without ataxia, coordination intact Normal finger to nose and rapid alternating movements No pronator drift  Skin: Skin is warm and dry. Capillary refill takes less than 2 seconds. He is not diaphoretic.  Psychiatric: He has a normal mood and affect. His behavior is normal.  Nursing note and vitals reviewed.    ED Treatments / Results  Labs (all labs ordered are listed, but only abnormal results are  displayed) Labs Reviewed - No data to display  EKG None  Radiology No results found.  Procedures Procedures (including critical care time)  Medications Ordered in ED Medications  sodium chloride 0.9 % bolus 1,000 mL (1,000 mLs Intravenous New Bag/Given 08/23/18 0806)  prochlorperazine (COMPAZINE) injection 10 mg (10 mg Intravenous Given 08/23/18 0810)  diphenhydrAMINE (BENADRYL) injection 25 mg (25 mg Intravenous Given 08/23/18 0808)  ketorolac (TORADOL) 30 MG/ML injection 30 mg (30 mg Intravenous Given 08/23/18 0807)  dexamethasone (DECADRON) injection 10 mg (10 mg Intravenous Given 08/23/18 0809)     Initial Impression / Assessment and Plan / ED Course  I have reviewed the triage vital signs and the nursing notes.  Pertinent labs & imaging results that were available during my care of the patient were reviewed by me and considered in my medical decision making (see chart for details).  Pt HA treated and improved while in ED.  Presentation is like pts typical HA and non concerning for [facility], ICH, Meningitis, or temporal arteritis. Pt is afebrile with no focal neuro deficits,  nuchal rigidity, or change in vision. Pt is to follow up with PCP to discuss prophylactic medication. Pt verbalizes understanding and is agreeable with plan to dc.   Final Clinical Impressions(s) / ED Diagnoses   Final diagnoses:  Bad headache    ED Discharge Orders    None       Dartha LodgeFord, Keyoni Lapinski N, [place] JerseyPA-C 08/23/18 [phone]    Arby BarrettePfeiffer, Marcy, MD 08/24/18 [phone]

## 2018-08-23 NOTE — Discharge Instructions (Signed)
May take Tylenol, ibuprofen or Excedrin for headaches in the future, please follow-up with your primary care doctor to discuss these headaches.  Return to the emergency department if any of the below symptoms occur.  Get help right away if: Your headache becomes really bad. You keep throwing up. You have a stiff neck. You have trouble seeing. You have trouble speaking. You have pain in the eye or ear. Your muscles are weak or you lose muscle control. You lose your balance or have trouble walking. You feel like you will pass out (faint) or you pass out. You have confusion.

## 2019-01-21 ENCOUNTER — Emergency Department (HOSPITAL_COMMUNITY): Payer: Self-pay

## 2019-01-21 ENCOUNTER — Emergency Department (HOSPITAL_COMMUNITY)
Admission: EM | Admit: 2019-01-21 | Discharge: 2019-01-21 | Disposition: A | Discharge status: Home / Self Care | Payer: Self-pay | Attending: Emergency Medicine | Admitting: Emergency Medicine

## 2019-01-21 ENCOUNTER — Other Ambulatory Visit: Payer: Self-pay

## 2019-01-21 DIAGNOSIS — F17210 Nicotine dependence, cigarettes, uncomplicated: Secondary | ICD-10-CM | POA: Insufficient documentation

## 2019-01-21 DIAGNOSIS — R072 Precordial pain: Secondary | ICD-10-CM | POA: Insufficient documentation

## 2019-01-21 MED ORDER — LORAZEPAM 1 MG PO TABS
1.0000 mg | ORAL_TABLET | Freq: Once | ORAL | Status: AC
  Administered 2019-01-21: 05:00:00 1 mg via ORAL
  Filled 2019-01-21: qty 1

## 2019-01-21 NOTE — ED Provider Notes (Signed)
[facility] Provider Note   CSN: [id number] Arrival date & time: 01/21/19  0454    History   Chief Complaint Chief Complaint  Patient presents with  . Anxiety    HPI Norman Stewart is a 28 y.o. male.     The history is provided by the patient.  Shortness of Breath  Severity:  Moderate Onset quality:  Gradual Timing:  Intermittent Progression:  Improving Chronicity:  New Relieved by:  Nothing Worsened by:  Nothing Associated symptoms: cough   Associated symptoms: no fever and no vomiting   Associated symptoms comment:  Chest tightness   Patient presents for chest tightness and shortness of breath.  He reports over the past 1 to 2 days he has had episodes of feeling short of breath, nasal congestion and his chest feels tight.  He reports tonight it seemed to worsen while he was sleeping.  He smokes cigarettes daily.  He does admit to cocaine use this past weekend.  He also reports taking a "sex pill "in the past 24 hours.  He thinks it was similar to Viagra. He denies any medical conditions   Past Medical History:  Diagnosis Date  . Head ache     There are no active problems to display for this patient.   No past surgical history on file.      Home Medications    Prior to Admission medications   Not on File    Family History No family history on file.  Social History Social History   Tobacco Use  . Smoking status: Current Every Day Smoker    Packs/day: 0.50    Types: Cigarettes  . Smokeless tobacco: Never Used  Substance Use Topics  . Alcohol use: Yes    Comment: weekends  . Drug use: No     Allergies   Patient has no known allergies.   Review of Systems Review of Systems  Constitutional: Negative for fever.  HENT: Positive for congestion.   Respiratory: Positive for cough and shortness of breath.   Gastrointestinal: Negative for vomiting.  Psychiatric/Behavioral: The patient is  nervous/anxious.   All other systems reviewed and are negative.    Physical Exam Updated Vital Signs BP 132/83   Pulse 68   Temp 97.9 F (36.6 C) (Oral)   Resp (!) 21   SpO2 91%   Physical Exam  CONSTITUTIONAL: Well developed/well nourished HEAD: Normocephalic/atraumatic EYES: EOMI ENMT: Mucous membranes moist NECK: supple no meningeal signs SPINE/BACK:entire spine nontender CV: S1/S2 noted, no murmurs/rubs/gallops noted LUNGS: Lungs are clear to auscultation bilaterally, no apparent distress ABDOMEN: soft, nontender, no rebound or guarding, bowel sounds noted throughout abdomen GU:no cva tenderness NEURO: Pt is awake/alert/appropriate, moves all extremitiesx4.  No facial droop.   EXTREMITIES: pulses normal/equal, full ROM SKIN: warm, color normal PSYCH: mildly anxious  ED Treatments / Results  Labs (all labs ordered are listed, but only abnormal results are displayed) Labs Reviewed - No data to display  EKG EKG Interpretation  Date/Time:  Monday January 21 2019 05:10:29 EDT Ventricular Rate:  76 PR Interval:    QRS Duration: 85 QT Interval:  363 QTC Calculation: 409 R Axis:   72 Text Interpretation:  Sinus rhythm No previous ECGs available Confirmed by Zadie RhineWickline, Zaara Sprowl ([id number]) on 01/21/2019 5:26:10 AM   Radiology Dg Chest 2 View  Result Date: 01/21/2019 CLINICAL DATA:  Chest tightness.  Congestion. EXAM: CHEST - 2 VIEW COMPARISON:  None. FINDINGS: The heart size and mediastinal contours  are within normal limits. Both lungs are clear. The visualized skeletal structures are unremarkable. IMPRESSION: Negative two view chest x-ray Electronically Signed   By: Marin Roberts M.D.   On: 01/21/2019 06:07    Procedures Procedures  Medications Ordered in ED Medications  LORazepam (ATIVAN) tablet 1 mg (has no administration in time range)     Initial Impression / Assessment and Plan / ED Course  I have reviewed the triage vital signs and the nursing notes.   Pertinent  imaging results that were available during my care of the patient were reviewed by me and considered in my medical decision making (see chart for details).        Presented for symptoms of shortness of breath and chest tightness mostly at night.  He also reports nasal congestion that seem to trigger some of his symptoms.  He also admits to recent cocaine use.  EKG chest x-ray is negative.  Patient is now resting comfortably and in no acute distress.  My suspicion for ACS/PE is low.  Will discharge home.  Final Clinical Impressions(s) / ED Diagnoses   Final diagnoses:  Precordial pain    ED Discharge Orders    None       Zadie Rhine, MD 01/21/19 [phone]

## 2019-01-21 NOTE — Discharge Instructions (Addendum)

## 2019-01-21 NOTE — ED Triage Notes (Signed)
Per pt he has anxiety. Pt is having congestion in his nose and has a panic attack. Pt said he feels good but gets overwhelmed.

## 2019-09-24 ENCOUNTER — Other Ambulatory Visit: Payer: Self-pay

## 2019-09-24 ENCOUNTER — Encounter (HOSPITAL_COMMUNITY): Payer: Self-pay

## 2019-09-24 ENCOUNTER — Ambulatory Visit (HOSPITAL_COMMUNITY)
Admission: EM | Admit: 2019-09-24 | Discharge: 2019-09-24 | Disposition: A | Discharge status: Home / Self Care | Payer: HRSA Program | Attending: Family Medicine | Admitting: Family Medicine

## 2019-09-24 DIAGNOSIS — Z20828 Contact with and (suspected) exposure to other viral communicable diseases: Secondary | ICD-10-CM | POA: Diagnosis not present

## 2019-09-24 DIAGNOSIS — L24A0 Irritant contact dermatitis due to friction or contact with body fluids, unspecified: Secondary | ICD-10-CM

## 2019-09-24 DIAGNOSIS — Z202 Contact with and (suspected) exposure to infections with a predominantly sexual mode of transmission: Secondary | ICD-10-CM | POA: Insufficient documentation

## 2019-09-24 DIAGNOSIS — Z20822 Contact with and (suspected) exposure to covid-19: Secondary | ICD-10-CM

## 2019-09-24 DIAGNOSIS — L2489 Irritant contact dermatitis due to other agents: Secondary | ICD-10-CM | POA: Insufficient documentation

## 2019-09-24 DIAGNOSIS — Z113 Encounter for screening for infections with a predominantly sexual mode of transmission: Secondary | ICD-10-CM | POA: Diagnosis not present

## 2019-09-24 LAB — HIV ANTIBODY (ROUTINE TESTING W REFLEX): HIV Screen 4th Generation wRfx: NONREACTIVE

## 2019-09-24 LAB — HEPATITIS B SURFACE ANTIGEN: Hepatitis B Surface Ag: NONREACTIVE

## 2019-09-24 NOTE — Discharge Instructions (Addendum)
If your Covid-19 test is positive, you will receive a phone call from [facility] regarding your results. Negative test results are not called. Both positive and negative results area always visible on MyChart. If you do not have a MyChart account, sign up instructions are in your discharge papers.   Persons who are directed to care for themselves at home may discontinue isolation under the following conditions:   At least 10 days have passed since symptom onset and  At least 24 hours have passed without running a fever (this means without the use of fever-reducing medications) and  Other symptoms have improved.  Persons infected with COVID-19 who never develop symptoms may discontinue isolation and other precautions 10 days after the date of their first positive COVID-19 test.   We have sent out the tests we discussed and will call you with any positive results. You may check your mychart in 3-5 days to view negative results as we will not call with negative results.   If you were to develop symptoms as discussed such as discharge, painful urination or fever. Please come back to seek treatment.  Always practice safe sex with barrier methods such as condoms.   Utilize petroleum jelly for the place your leg that we discussed.

## 2019-09-24 NOTE — ED Triage Notes (Signed)
Pt states he needs to be tested for STDs . Pt states he would like to get the Covid test as well he has know symptoms.

## 2019-09-24 NOTE — ED Provider Notes (Signed)
[place]    CSN: [id number] Arrival date & time: 09/24/19  3151      History   Chief Complaint Chief Complaint  Patient presents with  . SEXUALLY TRANSMITTED DISEASE    HPI Norman Stewart is a 28 y.o. male.   Patient reports to urgent care today for concern of STD's and possible exposure to COVID. Patient reports he may have been expose to Covid 1.5 weeks ago. He was with his friend who had a close contact with Covid, and then developed fever, chills and fatigue. These symptoms mostly subsided 5 days ago but he does report a general "not feeling right" currently. He currently denies fever, chills, cough, congestion, chest pain, nausea, vomiting, diarrhea, loss of smell or taste.   His other concern is for STD testing as he recently found that his male partner is "prostituting" and he is very concerned he may be exposed to STDs. He does not report that she has had known positive tests. A part from the above mentioned "not feeling right" he denies symptoms currently of penile pain, discharge, painful urination or abdominal pain.   He also reports a rash on his right inner thigh. He notes some pain previously from it. Denies discharge or itching. He reports this has come and gone when he gets new pants or tight fitting pants. He has not treated this with anything.     Past Medical History:  Diagnosis Date  . Head ache     There are no problems to display for this patient.   History reviewed. No pertinent surgical history.     Home Medications    Prior to Admission medications   Not on File    Family History Family History  Problem Relation Age of Onset  . Healthy Mother   . Healthy Father     Social History Social History   Tobacco Use  . Smoking status: Current Every Day Smoker    Packs/day: 0.50    Types: Cigarettes  . Smokeless tobacco: Never Used  Substance Use Topics  . Alcohol use: Yes    Comment: weekends  . Drug use: No      Allergies   Patient has no known allergies.   Review of Systems Review of Systems  Constitutional: Negative for activity change, appetite change, chills and fever.  HENT: Negative for ear pain and sore throat.   Eyes: Negative for pain, itching and visual disturbance.  Respiratory: Negative for cough and shortness of breath.   Cardiovascular: Negative for chest pain and palpitations.  Gastrointestinal: Negative for abdominal pain, constipation, diarrhea, nausea, rectal pain and vomiting.  Genitourinary: Negative for discharge, dysuria, flank pain, genital sores, hematuria, penile pain, penile swelling, scrotal swelling, testicular pain and urgency.  Musculoskeletal: Negative for arthralgias, back pain and myalgias.  Skin: Positive for rash. Negative for color change.  Neurological: Negative for syncope, light-headedness and headaches.  Hematological: Negative for adenopathy.  All other systems reviewed and are negative.    Physical Exam Triage Vital Signs ED Triage Vitals  Enc Vitals Group     BP 09/24/19 0936 (!) 148/77     Pulse Rate 09/24/19 0936 89     Resp 09/24/19 0936 18     Temp 09/24/19 0936 100 F (37.8 C)     Temp Source 09/24/19 0936 Oral     SpO2 09/24/19 0936 100 %     Weight 09/24/19 0933 193 lb 6.4 oz (87.7 kg)     Height --  Head Circumference --      Peak Flow --      Pain Score --      Pain Loc --      Pain Edu? --      Excl. in GC? --    No data found.  Updated Vital Signs BP (!) 148/77 (BP Location: Right Arm)   Pulse 89   Temp 100 F (37.8 C) (Oral)   Resp 18   Wt 193 lb 6.4 oz (87.7 kg)   SpO2 100%   BMI 31.22 kg/m   Visual Acuity Right Eye Distance:   Left Eye Distance:   Bilateral Distance:    Right Eye Near:   Left Eye Near:    Bilateral Near:     Physical Exam Vitals and nursing note reviewed.  Constitutional:      Appearance: He is well-developed.  HENT:     Head: Normocephalic and atraumatic.     Nose: No  congestion or rhinorrhea.     Mouth/Throat:     Mouth: Mucous membranes are moist.     Pharynx: Oropharynx is clear. No oropharyngeal exudate.  Eyes:     General: No scleral icterus.       Right eye: No discharge.        Left eye: No discharge.     Conjunctiva/sclera: Conjunctivae normal.  Cardiovascular:     Rate and Rhythm: Normal rate and regular rhythm.     Pulses: Normal pulses.     Heart sounds: No murmur.  Pulmonary:     Effort: Pulmonary effort is normal. No respiratory distress.     Breath sounds: Normal breath sounds.  Abdominal:     Palpations: Abdomen is soft.     Tenderness: There is no abdominal tenderness.  Genitourinary:    Penis: Normal.      Testes: Normal.     Comments: No discharge or lesions noted Musculoskeletal:     Cervical back: Normal range of motion and neck supple.  Lymphadenopathy:     Cervical: No cervical adenopathy.  Skin:    General: Skin is warm and dry.     Comments: 1.5cm x 2cm eczematous rash with dark scarring on medial mid Right thigh. No crusting or vesicles  Neurological:     General: No focal deficit present.     Mental Status: He is alert and oriented to person, place, and time.  Psychiatric:        Mood and Affect: Mood normal.        Behavior: Behavior normal.        Thought Content: Thought content normal.        Judgment: Judgment normal.      UC Treatments / Results  Labs (all labs ordered are listed, but only abnormal results are displayed) Labs Reviewed  NOVEL CORONAVIRUS, NAA (HOSP ORDER, SEND-OUT TO REF LAB; TAT 18-24 HRS)  HIV ANTIBODY (ROUTINE TESTING W REFLEX)  RPR  HEPATITIS B SURFACE ANTIGEN  CYTOLOGY, (ORAL, ANAL, URETHRAL) ANCILLARY ONLY    EKG   Radiology No results found.  Procedures Procedures (including critical care time)  Medications Ordered in UC Medications - No data to display  Initial Impression / Assessment and Plan / UC Course  I have reviewed the triage vital signs and the nursing  notes.  Pertinent labs & imaging results that were available during my care of the patient were reviewed by me and considered in my medical decision making (see chart for details).     #  Potential STD Exposure #Leg Rash #Possible COVID exposure - Currently asymptomatic with exception of uneasy feeling. History with potential previous symptoms of COVID, PCR sent. No genital symptoms Sent GC/CT/Trich, HIV, RPR and Hep B due to sex with high risk population.  Rash is like friction dermatitis, recommended vaseline and undergarments to cover.  ED precautions and COVID guidance given.   Final Clinical Impressions(s) / UC Diagnoses   Final diagnoses:  Exposure to COVID-19 virus  Potential exposure to STD  Irritant contact dermatitis due to friction     Discharge Instructions     If your Covid-19 test is positive, [facility]. Negative test results are not called. Both positive and negative results area always visible on MyChart. If you do not have a MyChart account, sign up instructions are in your discharge papers.   Persons who are directed to care for themselves at home may discontinue isolation under the following conditions:  . At least 10 days have passed since symptom onset and . At least 24 hours have passed without running a fever (this means without the use of fever-reducing medications) and . Other symptoms have improved.  Persons infected with COVID-19 who never develop symptoms may discontinue isolation and other precautions 10 days after the date of their first positive COVID-19 test.   We have sent out the tests we discussed and will call you with any positive results. You may check your mychart in 3-5 days to view negative results as we will not call with negative results.   If you were to develop symptoms as discussed such as discharge, painful urination or fever. Please come back to seek treatment.  Always  practice safe sex with barrier methods such as condoms.   Utilize petroleum jelly for the place your leg that we discussed.     ED Prescriptions    None     PDMP not reviewed this encounter.   Hermelinda Medicusarr, Cheyann Blecha E, PA-C 09/24/19 1028

## 2019-09-25 LAB — RPR: RPR Ser Ql: NONREACTIVE

## 2019-09-26 LAB — NOVEL CORONAVIRUS, NAA (HOSP ORDER, SEND-OUT TO REF LAB; TAT 18-24 HRS): SARS-CoV-2, NAA: NOT DETECTED

## 2019-10-30 ENCOUNTER — Emergency Department (HOSPITAL_COMMUNITY)
Admission: EM | Admit: 2019-10-30 | Discharge: 2019-10-30 | Discharge status: Against Medical Advice | Payer: Self-pay | Attending: Emergency Medicine | Admitting: Emergency Medicine

## 2019-10-30 ENCOUNTER — Encounter (HOSPITAL_COMMUNITY): Payer: Self-pay

## 2019-10-30 DIAGNOSIS — Z5321 Procedure and treatment not carried out due to patient leaving prior to being seen by health care provider: Secondary | ICD-10-CM | POA: Insufficient documentation

## 2019-10-30 NOTE — ED Triage Notes (Signed)
Pt states that he fell onto his shoulder, normally he is able to pop it back in himself but today states he cant, no deformity noted, pt is able to freely move shoulder. ETOH on board

## 2019-10-30 NOTE — ED Notes (Signed)
Pt has left the building, stated that he was not waiting anymore and has left the facility and is off the grounds

## 2019-12-01 ENCOUNTER — Other Ambulatory Visit: Payer: Self-pay

## 2019-12-01 ENCOUNTER — Emergency Department (HOSPITAL_COMMUNITY)
Admission: EM | Admit: 2019-12-01 | Discharge: 2019-12-01 | Disposition: A | Discharge status: Home / Self Care | Payer: Self-pay | Attending: Emergency Medicine | Admitting: Emergency Medicine

## 2019-12-01 DIAGNOSIS — M7918 Myalgia, other site: Secondary | ICD-10-CM | POA: Diagnosis not present

## 2019-12-01 DIAGNOSIS — F17210 Nicotine dependence, cigarettes, uncomplicated: Secondary | ICD-10-CM | POA: Insufficient documentation

## 2019-12-01 MED ORDER — ACETAMINOPHEN 325 MG PO TABS
650.0000 mg | ORAL_TABLET | Freq: Once | ORAL | Status: AC
  Administered 2019-12-01: 650 mg via ORAL
  Filled 2019-12-01: qty 2

## 2019-12-01 MED ORDER — METHOCARBAMOL 500 MG PO TABS: 500.0000 mg | ORAL_TABLET | Freq: Two times a day (BID) | ORAL | 0 refills | Status: AC

## 2019-12-01 MED ORDER — LIDOCAINE 5 % EX PTCH: 1.0000 | MEDICATED_PATCH | CUTANEOUS | 0 refills | Status: DC
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
## 2020-01-02 ENCOUNTER — Encounter (HOSPITAL_BASED_OUTPATIENT_CLINIC_OR_DEPARTMENT_OTHER): Payer: Self-pay | Admitting: *Deleted

## 2020-01-02 ENCOUNTER — Emergency Department (HOSPITAL_BASED_OUTPATIENT_CLINIC_OR_DEPARTMENT_OTHER)
Admission: EM | Admit: 2020-01-02 | Discharge: 2020-01-02 | Disposition: A | Discharge status: Home / Self Care | Payer: Self-pay | Attending: Emergency Medicine | Admitting: Emergency Medicine

## 2020-01-02 ENCOUNTER — Other Ambulatory Visit: Payer: Self-pay

## 2020-01-02 DIAGNOSIS — F419 Anxiety disorder, unspecified: Secondary | ICD-10-CM | POA: Insufficient documentation

## 2020-01-02 DIAGNOSIS — R531 Weakness: Secondary | ICD-10-CM | POA: Insufficient documentation

## 2020-01-02 DIAGNOSIS — F17210 Nicotine dependence, cigarettes, uncomplicated: Secondary | ICD-10-CM | POA: Insufficient documentation

## 2020-01-02 MED ORDER — HYDROXYZINE HCL 25 MG PO TABS: 25.0000 mg | ORAL_TABLET | Freq: Three times a day (TID) | ORAL | 0 refills | Status: DC | PRN

## 2020-01-02 NOTE — ED Triage Notes (Addendum)
Woke up yesterday with a feeling of jittery, weak and not feeling right. Smoked weed yesterday. Took 1/2 of the pill that his friend gave him (Hydroxyzine per his mom).

## 2020-01-02 NOTE — ED Provider Notes (Signed)
[facility] EMERGENCY DEPARTMENT Provider Note   CSN: [id number] Arrival date & time: 01/02/20  1826     History Chief Complaint  Patient presents with  . Weakness  . Anxiety    Norman Stewart is a 29 y.o. male.  Patient here feeling anxious after smoking weed, cocaine, using cigarettes, drinking on and off the last several days.  The history is provided by the patient.  Illness Location:  General Severity:  Mild Onset quality:  Gradual Timing:  Intermittent Progression:  Waxing and waning Chronicity:  New Associated symptoms: no abdominal pain, no chest pain, no cough, no ear pain, no fever, no rash, no shortness of breath, no sore throat and no vomiting        Past Medical History:  Diagnosis Date  . Head ache     There are no problems to display for this patient.   History reviewed. No pertinent surgical history.     Family History  Problem Relation Age of Onset  . Healthy Mother   . Healthy Father     Social History   Tobacco Use  . Smoking status: Current Every Day Smoker    Packs/day: 0.50    Types: Cigarettes  . Smokeless tobacco: Never Used  Substance Use Topics  . Alcohol use: Yes    Comment: 10 shots of liquor every other day  . Drug use: Yes    Types: Marijuana    Comment: yesterday    Home Medications Prior to Admission medications   Medication Sig Start Date End Date Taking? Authorizing Provider  hydrOXYzine (ATARAX/VISTARIL) 25 MG tablet Take 1 tablet (25 mg total) by mouth every 8 (eight) hours as needed for up to 10 doses. 01/02/20   Lennice Sites, DO    Allergies    Patient has no known allergies.  Review of Systems   Review of Systems  Constitutional: Negative for chills and fever.  HENT: Negative for ear pain and sore throat.   Eyes: Negative for pain and visual disturbance.  Respiratory: Negative for cough and shortness of breath.   Cardiovascular: Negative for chest pain and palpitations.    Gastrointestinal: Negative for abdominal pain and vomiting.  Genitourinary: Negative for dysuria and hematuria.  Musculoskeletal: Negative for arthralgias and back pain.  Skin: Negative for color change and rash.  Neurological: Negative for seizures and syncope.  Psychiatric/Behavioral: The patient is nervous/anxious.   All other systems reviewed and are negative.   Physical Exam Updated Vital Signs There were no vitals taken for this visit.  Physical Exam Vitals and nursing note reviewed.  Constitutional:      Appearance: He is well-developed.  HENT:     Head: Normocephalic and atraumatic.  Eyes:     Conjunctiva/sclera: Conjunctivae normal.  Cardiovascular:     Rate and Rhythm: Normal rate and regular rhythm.     Heart sounds: No murmur.  Pulmonary:     Effort: Pulmonary effort is normal. No respiratory distress.     Breath sounds: Normal breath sounds.  Abdominal:     Palpations: Abdomen is soft.     Tenderness: There is no abdominal tenderness.  Musculoskeletal:     Cervical back: Neck supple.  Skin:    General: Skin is warm and dry.  Neurological:     General: No focal deficit present.     Mental Status: He is alert and oriented to person, place, and time.     Cranial Nerves: No cranial nerve deficit.  Sensory: No sensory deficit.     Motor: No weakness.     Coordination: Coordination normal.     Gait: Gait normal.     ED Results / Procedures / Treatments   Labs (all labs ordered are listed, but only abnormal results are displayed) Labs Reviewed - No data to display  EKG None  Radiology No results found.  Procedures Procedures (including critical care time)  Medications Ordered in ED Medications - No data to display  ED Course  I have reviewed the triage vital signs and the nursing notes.  Pertinent labs & imaging results that were available during my care of the patient were reviewed by me and considered in my medical decision making (see  chart for details).    MDM Rules/Calculators/A&P                      Norman Stewart is a 29 year old male history of polysubstance abuse who presents to the ED with anxiousness.  Patient with normal vitals.  No fever.  Patient states that he felt anxious after smoking marijuana, doing cocaine, smoking cigarettes, drinking alcohol.  Suspect polysubstance issues causing his symptoms.  Normal neuro exam.  Clear breath sounds.  No distress.  Educated him about drug use and discharge patient from the ED in good condition.  Will trial Vistaril when patient is anxious.  Discharged in good condition.  Understands return precautions.  This chart was dictated using voice recognition software.  Despite best efforts to proofread,  errors can occur which can change the documentation meaning.   Final Clinical Impression(s) / ED Diagnoses Final diagnoses:  Anxiety    Rx / DC Orders ED Discharge Orders         Ordered    hydrOXYzine (ATARAX/VISTARIL) 25 MG tablet  Every 8 hours PRN     01/02/20 1907           Virgina Norfolk, DO 01/02/20 1909

## 2020-09-05 IMAGING — CR CHEST - 2 VIEW
2 series · 2 of 2 positions shown · non-contrast
Comparison: None.

CLINICAL DATA: Chest tightness.  Congestion.

EXAM:
CHEST - 2 VIEW

[chest pa]
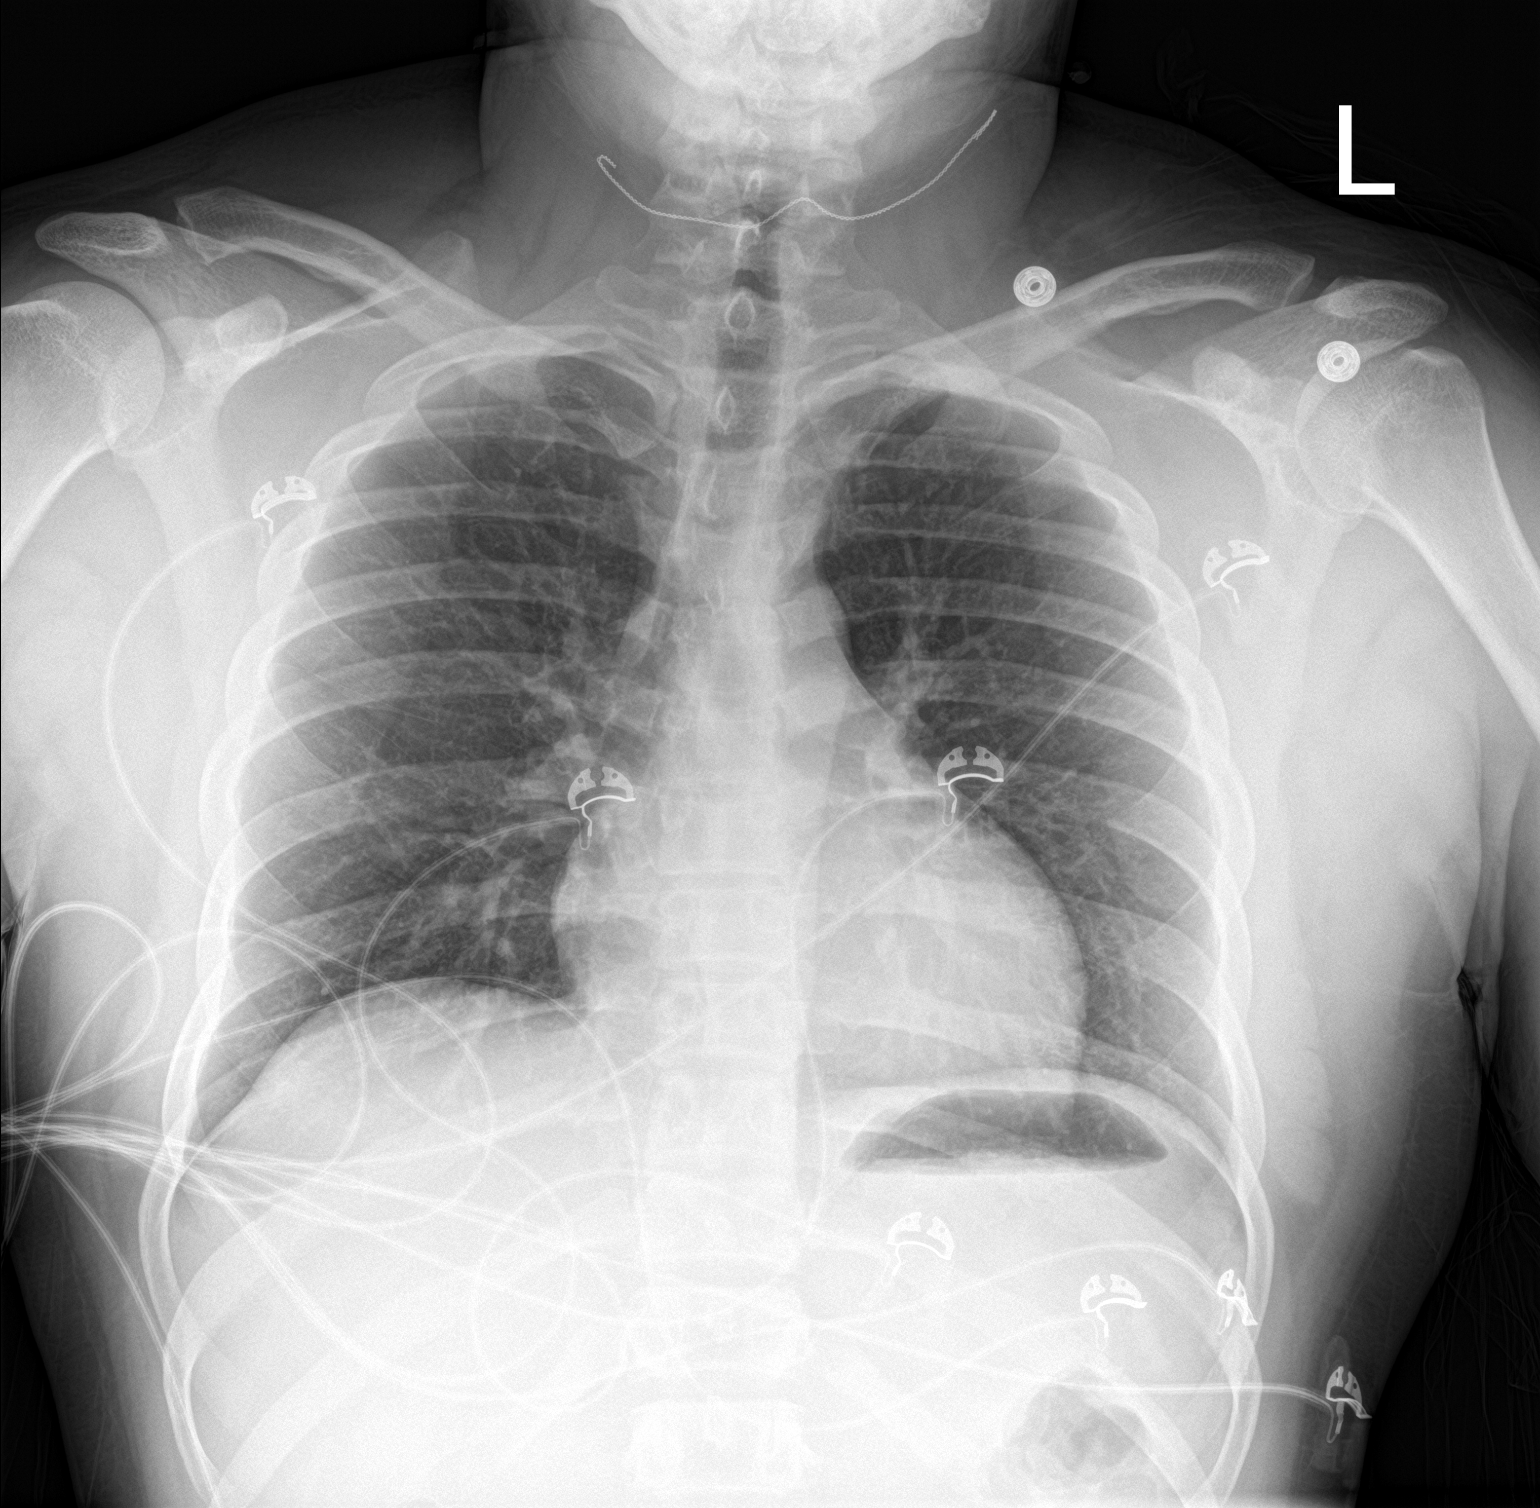

[chest lat]
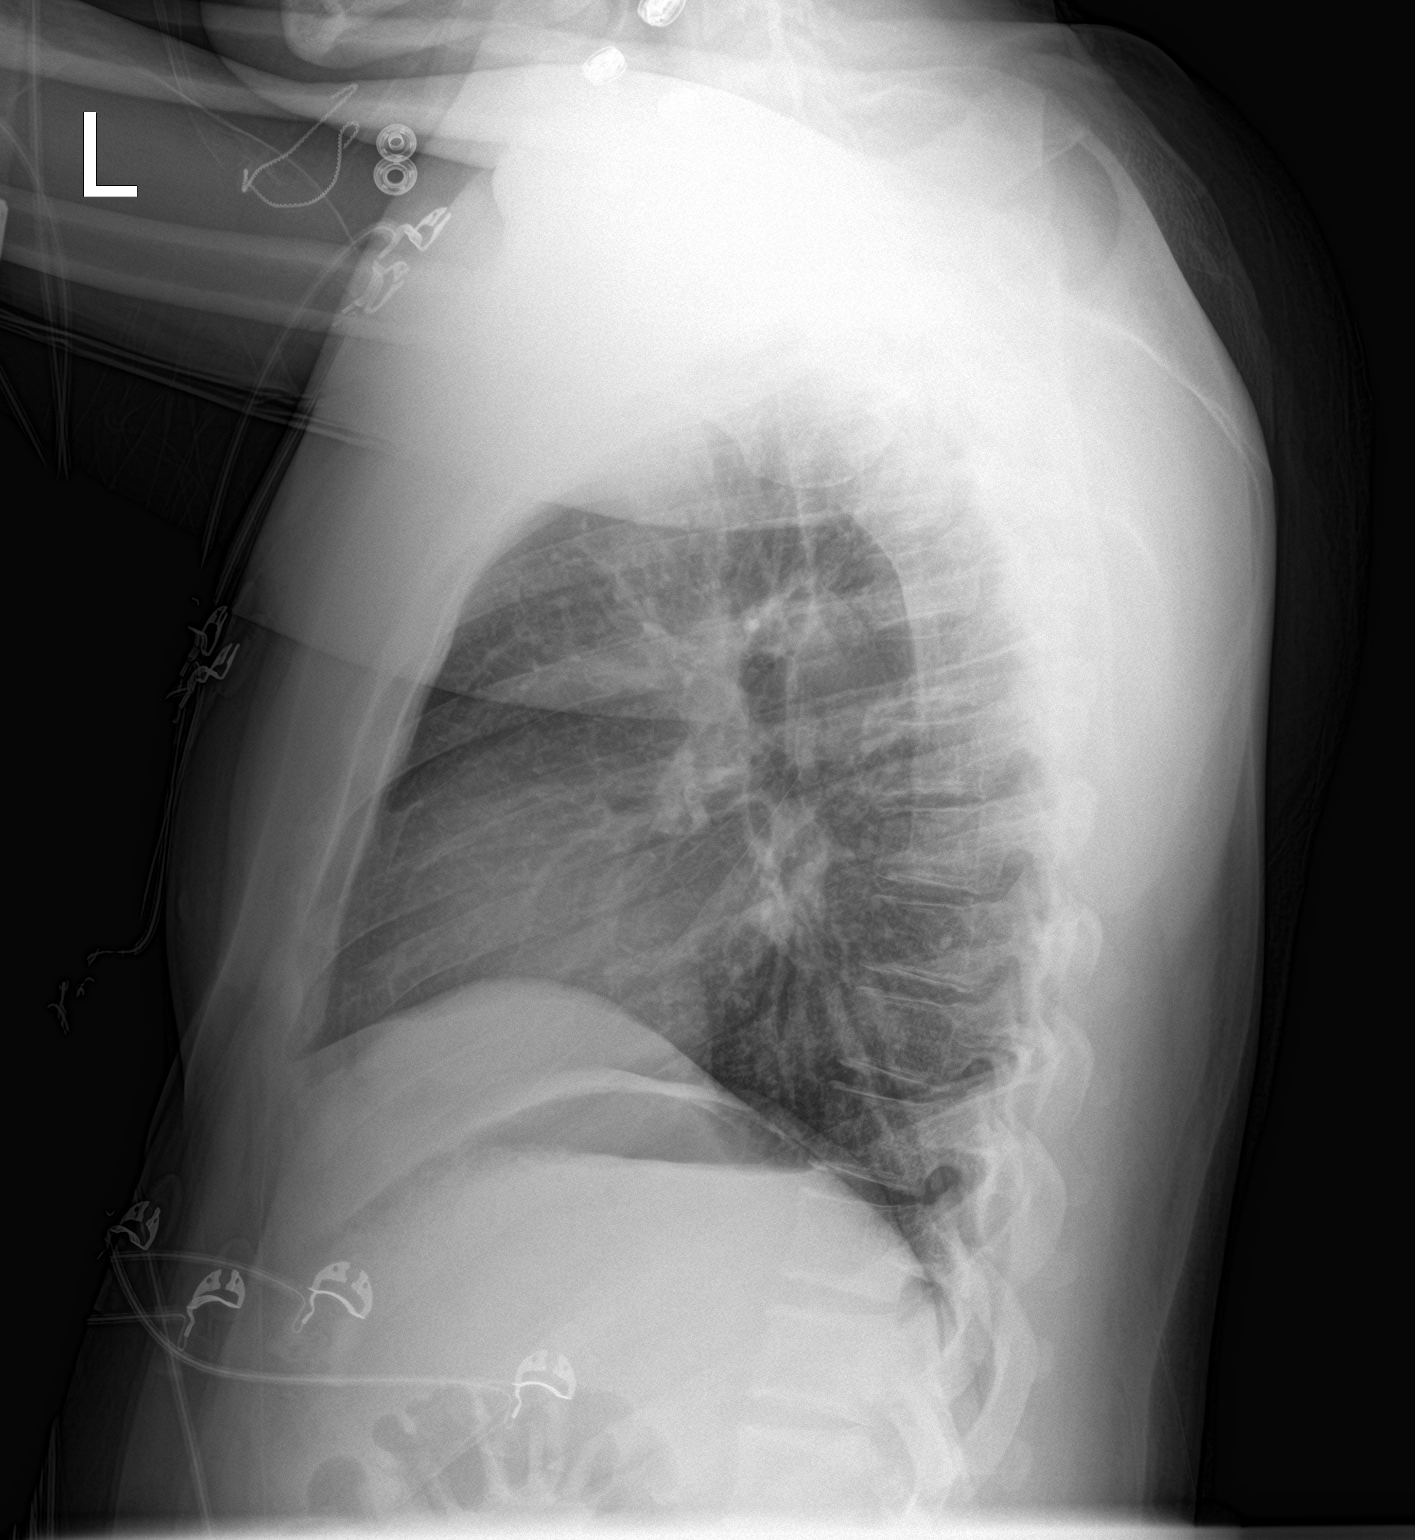

[2 of 2 positions shown; findings below may reference images not displayed]

FINDINGS: The heart size and mediastinal contours are within normal limits.
Both lungs are clear. The visualized skeletal structures are
unremarkable.
IMPRESSION: Negative two view chest x-ray

## 2022-10-03 ENCOUNTER — Encounter (HOSPITAL_BASED_OUTPATIENT_CLINIC_OR_DEPARTMENT_OTHER): Payer: Self-pay | Admitting: Emergency Medicine

## 2022-10-03 ENCOUNTER — Other Ambulatory Visit: Payer: Self-pay

## 2022-10-03 ENCOUNTER — Emergency Department (HOSPITAL_BASED_OUTPATIENT_CLINIC_OR_DEPARTMENT_OTHER)
Admission: EM | Admit: 2022-10-03 | Discharge: 2022-10-03 | Disposition: A | Discharge status: Home / Self Care | Payer: Self-pay | Attending: Emergency Medicine | Admitting: Emergency Medicine

## 2022-10-03 DIAGNOSIS — K1379 Other lesions of oral mucosa: Secondary | ICD-10-CM | POA: Insufficient documentation

## 2022-10-03 MED ORDER — DEXAMETHASONE 4 MG PO TABS
10.0000 mg | ORAL_TABLET | Freq: Once | ORAL | Status: AC
  Administered 2022-10-03: 10 mg via ORAL
  Filled 2022-10-03: qty 3

## 2022-10-03 MED ORDER — PANTOPRAZOLE SODIUM 20 MG PO TBEC: 20.0000 mg | DELAYED_RELEASE_TABLET | Freq: Every day | ORAL | 0 refills | Status: DC

## 2022-10-03 MED ORDER — ALUM & MAG HYDROXIDE-SIMETH 200-200-20 MG/5ML PO SUSP
30.0000 mL | Freq: Once | ORAL | Status: AC
  Administered 2022-10-03: 30 mL via ORAL
  Filled 2022-10-03: qty 30

## 2022-10-03 NOTE — ED Provider Notes (Signed)
[facility] EMERGENCY DEPARTMENT Provider Note   CSN: [id number] Arrival date & time: 10/03/22  1302     History  Chief Complaint  Patient presents with   Mouth Pain    Norman Stewart is a 31 y.o. male.  Patient here with mouth pain after throwing up a few days ago.  He is felt overall in his upper mouth.  Denies any nausea, vomiting currently.  No throat pain.  No headache, chest pain, shortness of breath or abdominal pain.  The history is provided by the patient.       Home Medications Prior to Admission medications   Medication Sig Start Date End Date Taking? Authorizing Provider  pantoprazole (PROTONIX) 20 MG tablet Take 1 tablet (20 mg total) by mouth daily for 14 days. 10/03/22 10/17/22 Yes Onyx Edgley, DO  hydrOXYzine (ATARAX/VISTARIL) 25 MG tablet Take 1 tablet (25 mg total) by mouth every 8 (eight) hours as needed for up to 10 doses. 01/02/20   Virgina Norfolk, DO      Allergies    Patient has no known allergies.    Review of Systems   Review of Systems  Physical Exam Updated Vital Signs BP (!) 127/91 (BP Location: Right Arm)   Pulse 64   Temp 98.3 F (36.8 C) (Oral)   Resp 18   SpO2 99%  Physical Exam Vitals and nursing note reviewed.  Constitutional:      General: He is not in acute distress.    Appearance: He is well-developed.  HENT:     Head: Normocephalic and atraumatic.     Nose: Nose normal.     Mouth/Throat:     Mouth: Mucous membranes are moist.     Comments: No obvious exudates, redness, ulcers, vesicles in the mouth Eyes:     Extraocular Movements: Extraocular movements intact.     Conjunctiva/sclera: Conjunctivae normal.     Pupils: Pupils are equal, round, and reactive to light.  Cardiovascular:     Rate and Rhythm: Normal rate and regular rhythm.     Heart sounds: No murmur heard. Pulmonary:     Effort: Pulmonary effort is normal. No respiratory distress.     Breath sounds: Normal breath sounds.  Abdominal:      Palpations: Abdomen is soft.     Tenderness: There is no abdominal tenderness.  Musculoskeletal:        General: No swelling.     Cervical back: Neck supple.  Skin:    General: Skin is warm and dry.     Capillary Refill: Capillary refill takes less than 2 seconds.  Neurological:     Mental Status: He is alert.  Psychiatric:        Mood and Affect: Mood normal.     ED Results / Procedures / Treatments   Labs (all labs ordered are listed, but only abnormal results are displayed) Labs Reviewed - No data to display  EKG None  Radiology No results found.  Procedures Procedures    Medications Ordered in ED Medications  alum & mag hydroxide-simeth (MAALOX/MYLANTA) 200-200-20 MG/5ML suspension 30 mL (has no administration in time range)  dexamethasone (DECADRON) tablet 10 mg (has no administration in time range)    ED Course/ Medical Decision Making/ A&P                           Medical Decision Making Risk OTC drugs. Prescription drug management.   Norman Stewart is  here with mouth pain after throwing up this past weekend.  Normal vitals.  No fever.  I do not see any evidence of strep throat or ulcers or traumatic process within the mouth.  He has no trismus or drooling.  I suspect maybe this is some irritation from vomiting that he had over the weekend.  Will treat with GI cocktail, Decadron, Protonix.  Discharged in good condition.  Understands return precautions.  This chart was dictated using voice recognition software.  Despite best efforts to proofread,  errors can occur which can change the documentation meaning.         Final Clinical Impression(s) / ED Diagnoses Final diagnoses:  Mouth pain    Rx / DC Orders ED Discharge Orders          Ordered    pantoprazole (PROTONIX) 20 MG tablet  Daily        10/03/22 1525              Helper, DO 10/03/22 1526

## 2022-10-03 NOTE — ED Notes (Signed)
Pt discharged to home. Discharge instructions have been discussed with patient and/or family members. Pt verbally acknowledges understanding d/c instructions, and endorses comprehension to checkout at registration before leaving.  °

## 2022-10-03 NOTE — ED Triage Notes (Signed)
Pt arrives pov, steady gait, c/o upper palate mouth pain. Denies sore throat. Endorse emesis on Saturday that has resolved.

## 2022-10-24 ENCOUNTER — Encounter (HOSPITAL_BASED_OUTPATIENT_CLINIC_OR_DEPARTMENT_OTHER): Payer: Self-pay | Admitting: *Deleted

## 2022-10-24 ENCOUNTER — Other Ambulatory Visit: Payer: Self-pay

## 2022-10-24 DIAGNOSIS — F17210 Nicotine dependence, cigarettes, uncomplicated: Secondary | ICD-10-CM | POA: Insufficient documentation

## 2022-10-24 DIAGNOSIS — Z20822 Contact with and (suspected) exposure to covid-19: Secondary | ICD-10-CM | POA: Insufficient documentation

## 2022-10-24 DIAGNOSIS — J101 Influenza due to other identified influenza virus with other respiratory manifestations: Secondary | ICD-10-CM | POA: Insufficient documentation

## 2022-10-24 LAB — RESP PANEL BY RT-PCR (RSV, FLU A&B, COVID)  RVPGX2
Influenza A by PCR: NEGATIVE
Influenza B by PCR: POSITIVE — AB
Resp Syncytial Virus by PCR: NEGATIVE
SARS Coronavirus 2 by RT PCR: NEGATIVE

## 2022-10-24 MED ORDER — IBUPROFEN 800 MG PO TABS
800.0000 mg | ORAL_TABLET | Freq: Once | ORAL | Status: AC
  Administered 2022-10-24: 800 mg via ORAL
  Filled 2022-10-24: qty 1

## 2022-10-24 NOTE — ED Notes (Signed)
2 cups of ice water given, po intake encouraged

## 2022-10-24 NOTE — ED Triage Notes (Signed)
Pt is here due to URI with cough, congestion, fever, body aches and fatigue.  Pt has been taking dayquil.  Last dose 8:48 2pills

## 2022-10-25 ENCOUNTER — Encounter (HOSPITAL_BASED_OUTPATIENT_CLINIC_OR_DEPARTMENT_OTHER): Payer: Self-pay | Admitting: Emergency Medicine

## 2022-10-25 ENCOUNTER — Emergency Department (HOSPITAL_BASED_OUTPATIENT_CLINIC_OR_DEPARTMENT_OTHER)
Admission: EM | Admit: 2022-10-25 | Discharge: 2022-10-25 | Disposition: A | Discharge status: Home / Self Care | Payer: Self-pay | Attending: Emergency Medicine | Admitting: Emergency Medicine

## 2022-10-25 DIAGNOSIS — J101 Influenza due to other identified influenza virus with other respiratory manifestations: Secondary | ICD-10-CM

## 2022-10-25 HISTORY — DX: Headache, unspecified: R51.9

## 2022-10-25 MED ORDER — IBUPROFEN 800 MG PO TABS: 800.0000 mg | ORAL_TABLET | Freq: Three times a day (TID) | ORAL | 0 refills | Status: DC | PRN

## 2022-10-25 NOTE — ED Provider Notes (Signed)
[facility] DEPT [facility] Provider Note: Georgena Spurling, MD, FACEP  CSN: [id number] MRN: [id number] ARRIVAL: 10/24/22 at 2142 ROOM: [place]  [facility]ness   HISTORY OF PRESENT ILLNESS  10/25/22 1:46 AM Norman Stewart is a 32 y.o. male with fever, cough, nasal congestion, body aches and fatigue for the past 3 days.  He rated his body aches as an 8 out of 10 on arrival.  He had been taking DayQuil at home last dose about 8:48 PM yesterday evening.  On arrival here his temperature was noted to be 102.5 and he was given ibuprofen.  He had significant relief of his symptoms with ibuprofen.   Past Medical History:  Diagnosis Date   Headache     History reviewed. No pertinent surgical history.  Family History  Problem Relation Age of Onset   Healthy Mother    Healthy Father     Social History   Tobacco Use   Smoking status: Every Day    Packs/day: 0.50    Types: Cigarettes   Smokeless tobacco: Never  Vaping Use   Vaping Use: Never used  Substance Use Topics   Alcohol use: Yes    Comment: 10 shots of liquor every other day   Drug use: Not Currently    Types: Marijuana    Comment: yesterday    Prior to Admission medications   Medication Sig Start Date End Date Taking? Authorizing Provider  ibuprofen (ADVIL) 800 MG tablet Take 1 tablet (800 mg total) by mouth every 8 (eight) hours as needed (Fever or bodyaches). 10/25/22  Yes Antwaun Buth, MD  hydrOXYzine (ATARAX/VISTARIL) 25 MG tablet Take 1 tablet (25 mg total) by mouth every 8 (eight) hours as needed for up to 10 doses. 01/02/20   Curatolo, Adam, DO  pantoprazole (PROTONIX) 20 MG tablet Take 1 tablet (20 mg total) by mouth daily for 14 days. 10/03/22 10/17/22  Lennice Sites, DO    Allergies Patient has no known allergies.   REVIEW OF SYSTEMS  Negative except as noted here or in the History of Present Illness.   PHYSICAL EXAMINATION  Initial Vital Signs Blood pressure 134/62, pulse 91,  temperature (!) 102.5 F (39.2 C), temperature source Oral, resp. rate 20, height 5\' 5"  (1.651 m), weight 93 kg, SpO2 98 %.  Examination General: Well-developed, well-nourished male in no acute distress; appearance consistent with age of record HENT: normocephalic; atraumatic Eyes: Normal appearance Neck: supple Heart: regular rate and rhythm Lungs: clear to auscultation bilaterally Abdomen: soft; nondistended; nontender; bowel sounds present Extremities: No deformity; full range of motion; pulses normal Neurologic: Awake, alert and oriented; motor function intact in all extremities and symmetric; no facial droop Skin: Warm and dry Psychiatric: Flat affect   RESULTS  Summary of this visit's results, reviewed and interpreted by myself:   EKG Interpretation  Date/Time:    Ventricular Rate:    PR Interval:    QRS Duration:   QT Interval:    QTC Calculation:   R Axis:     Text Interpretation:         Laboratory Studies: Results for orders placed or performed during the hospital encounter of 10/25/22 (from the past 24 hour(s))  Resp panel by RT-PCR (RSV, Flu A&B, Covid) Anterior Nasal Swab     Status: Abnormal   Collection Time: 10/24/22  9:59 PM   Specimen: Anterior Nasal Swab  Result Value Ref Range   SARS Coronavirus 2 by RT PCR NEGATIVE NEGATIVE   Influenza  A by PCR NEGATIVE NEGATIVE   Influenza B by PCR POSITIVE (A) NEGATIVE   Resp Syncytial Virus by PCR NEGATIVE NEGATIVE   Imaging Studies: No results found.  ED COURSE and MDM  Nursing notes, initial and subsequent vitals signs, including pulse oximetry, reviewed and interpreted by myself.  Vitals:   10/24/22 2153 10/24/22 2158 10/25/22 0145  BP: 134/62  115/74  Pulse: 91  67  Resp: 20  18  Temp: (!) 102.5 F (39.2 C)  98.2 F (36.8 C)  TempSrc: Oral  Oral  SpO2: 98%  99%  Weight:  93 kg   Height:  5\' 5"  (1.651 m)    Medications  ibuprofen (ADVIL) tablet 800 mg (800 mg Oral Given 10/24/22 2206)   The  patient is positive for influenza B.  He is outside the window for Tamiflu.  We will treat him with ibuprofen 800 mg as he got significant relief with this.  He is not in any respiratory distress and his lung sounds are clear.   PROCEDURES  Procedures   ED DIAGNOSES     ICD-10-CM   1. Influenza B  J10.1          Dorthy Magnussen, MD 10/25/22 [phone]

## 2022-10-25 NOTE — ED Notes (Signed)
Pt has had URI symptoms x 3 days with fevers +FLU

## 2022-10-27 ENCOUNTER — Ambulatory Visit: Payer: Self-pay | Admitting: *Deleted

## 2022-10-27 NOTE — Telephone Encounter (Signed)
Summary: flu like symptoms   The patient was seen at the [facility] on 10/25/22 and told that they have the flu  The patient shares that they are continuing to experience cough, congestion and headache  The patient shares that they have experienced dizziness and lightheadedness as well  Please contact further if/when possible         Attempted to call patient - left message to call back

## 2022-10-27 NOTE — Telephone Encounter (Signed)
Reason for Disposition  Fever present > 3 days (72 hours)  Answer Assessment - Initial Assessment Questions 1. WORST SYMPTOM: "What is your worst symptom?" (e.g., cough, runny nose, muscle aches, headache, sore throat, fever)  I was seen at the ED on [address] [place] on Monday and diagnosed with the flu.    I'm still feeling bad.  Coughing, congestion and headaches with dizziness and lightheadedness. I let him know it would take a week or 2 for the flu to run it's course but it it's still not getting better and he needs another note for work he would need to return to the ED.   He was agreeable to this plan.   The note out of work was only good through today.    I'm not well enough to return to work tomorrow.       2. ONSET: "When did your flu symptoms start?"      I was diagnosed on Monday at the ED 3. COUGH: "How bad is the cough?"       I'm still coughing 4. RESPIRATORY DISTRESS: "Describe your breathing."      congested 5. FEVER: "Do you have a fever?" If Yes, ask: "What is your temperature, how was it measured, and when did it start?"     Yes 6. EXPOSURE: "Were you exposed to someone with influenza?"       Not asked since already diagnosed with flu at the ED 7. FLU VACCINE: "Did you get a flu shot this year?"     Not asked 8. HIGH RISK DISEASE: "Do you have any chronic medical problems?" (e.g., heart or lung disease, asthma, weak immune system, or other HIGH RISK conditions)     Not asked 9. PREGNANCY: "Is there any chance you are pregnant?" "When was your last menstrual period?"     N/A 10. OTHER SYMPTOMS: "Do you have any other symptoms?"  (e.g., runny nose, muscle aches, headache, sore throat)       Coughing headaches, congestion, fever, feeling bad with dizziness and lightheadedness.  Protocols used: Influenza (Flu) - Seasonal-A-AH  Chief Complaint: Diagnosed with flu on Monday and still feeling bad.   Needs another work note. Symptoms: coughing,  congestion, headaches, lightheadedness. Frequency: All week Pertinent Negatives: Patient denies feeling any better since being seen on Monday at the ED. Disposition: [x] ED /[] Urgent Care (no appt availability in office) / [] Appointment(In office/virtual)/ []  [facility]/ [] Home Care/ [] Refused Recommended Disposition /[] [facility]/ []  Follow-up with PCP Additional Notes: Pt not feeling better and needs another note for work because he is not feeling well enough to return to work tomorrow.  He is going back to the ED he went to on Monday.  Does not have a PCP.

## 2023-03-23 ENCOUNTER — Emergency Department (HOSPITAL_COMMUNITY): Payer: Self-pay

## 2023-03-23 ENCOUNTER — Emergency Department (HOSPITAL_COMMUNITY)
Admission: EM | Admit: 2023-03-23 | Discharge: 2023-03-23 | Disposition: A | Discharge status: Home / Self Care | Payer: Self-pay | Attending: Emergency Medicine | Admitting: Emergency Medicine

## 2023-03-23 ENCOUNTER — Other Ambulatory Visit: Payer: Self-pay

## 2023-03-23 ENCOUNTER — Encounter (HOSPITAL_COMMUNITY): Payer: Self-pay

## 2023-03-23 DIAGNOSIS — L03115 Cellulitis of right lower limb: Secondary | ICD-10-CM | POA: Insufficient documentation

## 2023-03-23 DIAGNOSIS — M79671 Pain in right foot: Secondary | ICD-10-CM

## 2023-03-23 MED ORDER — DOXYCYCLINE HYCLATE 100 MG PO TABS
100.0000 mg | ORAL_TABLET | Freq: Once | ORAL | Status: AC
  Administered 2023-03-23: 100 mg via ORAL
  Filled 2023-03-23: qty 1

## 2023-03-23 MED ORDER — DOXYCYCLINE HYCLATE 100 MG PO CAPS: 100.0000 mg | ORAL_CAPSULE | Freq: Two times a day (BID) | ORAL | 0 refills | Status: DC

## 2023-03-23 MED ORDER — NAPROXEN 500 MG PO TABS: 500.0000 mg | ORAL_TABLET | Freq: Two times a day (BID) | ORAL | 0 refills | Status: DC

## 2023-03-23 MED ORDER — IBUPROFEN 400 MG PO TABS
600.0000 mg | ORAL_TABLET | Freq: Once | ORAL | Status: AC
  Administered 2023-03-23: 600 mg via ORAL
  Filled 2023-03-23: qty 1

## 2023-03-23 NOTE — ED Provider Notes (Signed)
[place] EMERGENCY DEPARTMENT AT [facility] Provider Note   CSN: [id number] Arrival date & time: 03/23/23  0732     History  Chief Complaint  Patient presents with   Foot Pain    Norman Stewart is a 32 y.o. male.  Norman Stewart is a 32 y.o. male who is otherwise healthy, presents to the ED for eval ration of pain over the top of his right foot that started yesterday.  He was at the pool over the weekend and thought he might of gotten bit by something at the pool.  He noticed redness at the top of his foot but yesterday while trying to stand all day at work it started to bother him increasingly.  Redness has not extended up his leg and he has not noticed any swelling.  He does report that he was wearing flip-flops and developed a blister in between his first and second toes where the redness begins.  He also feels like he noted a bump up at the top of his thigh in his groin.  No fevers or chills.  No trauma or injury to the foot or leg.  No other aggravating or alleviating factors.  The history is provided by the patient.       Home Medications Prior to Admission medications   Medication Sig Start Date End Date Taking? Authorizing Provider  doxycycline (VIBRAMYCIN) 100 MG capsule Take 1 capsule (100 mg total) by mouth 2 (two) times daily. One po bid x 7 days 03/23/23  Yes Dartha Lodge, PA-C  naproxen (NAPROSYN) 500 MG tablet Take 1 tablet (500 mg total) by mouth 2 (two) times daily. 03/23/23  Yes Dartha Lodge, PA-C  hydrOXYzine (ATARAX/VISTARIL) 25 MG tablet Take 1 tablet (25 mg total) by mouth every 8 (eight) hours as needed for up to 10 doses. 01/02/20   Curatolo, Adam, DO  ibuprofen (ADVIL) 800 MG tablet Take 1 tablet (800 mg total) by mouth every 8 (eight) hours as needed (Fever or bodyaches). 10/25/22   Molpus, John, MD  pantoprazole (PROTONIX) 20 MG tablet Take 1 tablet (20 mg total) by mouth daily for 14 days. 10/03/22 10/17/22  Virgina Norfolk, DO       Allergies    Patient has no known allergies.    Review of Systems   Review of Systems  Constitutional:  Negative for chills and fever.  Musculoskeletal:  Positive for arthralgias.  Skin:  Positive for color change.    Physical Exam Updated Vital Signs BP (!) 138/103   Pulse (!) 59   Temp 98.9 F (37.2 C) (Oral)   Resp 16   SpO2 98%  Physical Exam Vitals and nursing note reviewed.  Constitutional:      General: He is not in acute distress.    Appearance: Normal appearance. He is well-developed. He is not ill-appearing or diaphoretic.  HENT:     Head: Normocephalic and atraumatic.  Eyes:     General:        Right eye: No discharge.        Left eye: No discharge.  Pulmonary:     Effort: Pulmonary effort is normal. No respiratory distress.  Musculoskeletal:     Comments: Tenderness over the top of the right foot with area of redness extending from between the first and second toes where there is a small blister to the midfoot (see photo below).  There is no appreciable swelling or crepitus.  Patient is able to move  all toes and ankle without difficulty.  No tenderness throughout the remainder of the leg and no redness or warmth noted.  There is a small area of tenderness groin which I suspect is a deep lymph node patient with no other enlarged lymph nodes erythema or swelling in this area.  Normal range of motion of the hip without pain.  Neurological:     Mental Status: He is alert and oriented to person, place, and time.     Coordination: Coordination normal.  Psychiatric:        Mood and Affect: Mood normal.        Behavior: Behavior normal.     ED Results / Procedures / Treatments   Labs (all labs ordered are listed, but only abnormal results are displayed) Labs Reviewed - No data to display  EKG None  Radiology DG Foot Complete Right  Result Date: 03/23/2023 CLINICAL DATA:  Redness and pain on top of right foot since yesterday. Redness across base of first,  second, and third toes and up into foot. No known injury. EXAM: RIGHT FOOT COMPLETE - 3+ VIEW COMPARISON:  Right tibia and fibula radiographs 07/16/2022 FINDINGS: Mild hallux valgus. Minimal degenerative articular surface irregularity of the great toe metatarsal head but preservation of the great toe metatarsophalangeal joint space. The cortices are intact.  No subcutaneous air. No acute fracture is seen.  No dislocation. IMPRESSION: 1. Mild hallux valgus. 2. Minimal degenerative articular surface irregularity of the great toe metatarsal head without joint space narrowing. 3. No radiographic evidence of osteomyelitis. Electronically Signed   By: Neita Garnet M.D.   On: 03/23/2023 08:36    Procedures Procedures    Medications Ordered in ED Medications  ibuprofen (ADVIL) tablet 600 mg (600 mg Oral Given 03/23/23 0806)  doxycycline (VIBRA-TABS) tablet 100 mg (100 mg Oral Given 03/23/23 0981)    ED Course/ Medical Decision Making/ A&P                             Medical Decision Making Amount and/or Complexity of Data Reviewed Radiology: ordered.  Risk Prescription drug management.   32 year old male presents with right foot pain, there is some erythema but no palpable bony tenderness, x-rays obtained, viewed and interpreted independently without evidence of fracture, some degenerative change noted at the first MTP.  Differential includes cellulitis, could also have skin infection or local inflammatory reaction from bug bite, considered gout but this seems to not be directly located over the joint and patient is able to move the foot without significant pain so less suspicious for this.  Will treat with course of doxycycline and anti-inflammatories, drew border over the area of redness and I have requested the patient return to the ED in 2 days if redness is not receding or extends outside of the drawn border of redness.  At this time there does not appear to be any evidence of an acute  emergency medical condition requiring further emergent evaluation and the patient appears stable for discharge with appropriate outpatient follow up. Diagnosis and return precautions discussed with patient who verbalizes understanding and is agreeable to discharge.           Final Clinical Impression(s) / ED Diagnoses Final diagnoses:  Foot pain, right  Cellulitis of right foot    Rx / DC Orders ED Discharge Orders          Ordered    doxycycline (VIBRAMYCIN) 100 MG capsule  2 times daily        03/23/23 0919    naproxen (NAPROSYN) 500 MG tablet  2 times daily        03/23/23 0919              Dartha Lodge, PA-C 03/23/23 7829    Arby Barrette, MD 03/28/23 [phone]

## 2023-03-23 NOTE — ED Triage Notes (Signed)
Pt to ED via POV with c/o right metatarsal pain. Pt reports it started yesterday but increased today. Reports he can't bear weight on right foot, denies injury. Pt stated he might of got bit by a bug, redness noted on top of foot.

## 2023-03-23 NOTE — Discharge Instructions (Addendum)
You have an area of redness on your foot and I suspect you have cellulitis (skin infection) that is causing your pain, your x-ray overall looked good.  Take prescribed antibiotics and anti-inflammatory pain medication twice daily with food.  Monitor the redness on your foot closely if it is not improving and redness is spreading outside of the border we marked on your foot despite 2 days of antibiotics you should come back to the emergency department for recheck.  If you develop fevers, redness become significantly worse quickly or you have other new or worsening symptoms please return to the emergency department.

## 2024-01-28 ENCOUNTER — Emergency Department (HOSPITAL_BASED_OUTPATIENT_CLINIC_OR_DEPARTMENT_OTHER): Payer: Self-pay

## 2024-01-28 ENCOUNTER — Encounter (HOSPITAL_BASED_OUTPATIENT_CLINIC_OR_DEPARTMENT_OTHER): Payer: Self-pay | Admitting: Emergency Medicine

## 2024-01-28 DIAGNOSIS — S7011XA Contusion of right thigh, initial encounter: Secondary | ICD-10-CM | POA: Insufficient documentation
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
## 2024-01-29 ENCOUNTER — Emergency Department (HOSPITAL_BASED_OUTPATIENT_CLINIC_OR_DEPARTMENT_OTHER)
Admission: EM | Admit: 2024-01-29 | Discharge: 2024-01-29 | Disposition: A | Discharge status: Home / Self Care | Payer: Self-pay | Attending: Emergency Medicine | Admitting: Emergency Medicine

## 2024-01-29 DIAGNOSIS — S70311A Abrasion, right thigh, initial encounter: Secondary | ICD-10-CM

## 2024-01-29 DIAGNOSIS — S7011XA Contusion of right thigh, initial encounter: Secondary | ICD-10-CM
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
## 2024-01-29 NOTE — ED Notes (Signed)
Pt declines DC vitals

## 2024-05-16 ENCOUNTER — Encounter (HOSPITAL_COMMUNITY): Payer: Self-pay | Admitting: *Deleted

## 2024-05-16 ENCOUNTER — Other Ambulatory Visit: Payer: Self-pay

## 2024-05-16 ENCOUNTER — Ambulatory Visit (HOSPITAL_COMMUNITY)
Admission: EM | Admit: 2024-05-16 | Discharge: 2024-05-16 | Disposition: A | Discharge status: Home / Self Care | Payer: Self-pay

## 2024-05-16 DIAGNOSIS — M7918 Myalgia, other site: Secondary | ICD-10-CM

## 2024-05-16 MED ORDER — DICLOFENAC SODIUM 50 MG PO TBEC: 50.0000 mg | DELAYED_RELEASE_TABLET | Freq: Two times a day (BID) | ORAL | 1 refills | Status: AC

## 2024-05-16 MED ORDER — BACLOFEN 10 MG PO TABS: 10.0000 mg | ORAL_TABLET | Freq: Three times a day (TID) | ORAL | 0 refills | Status: AC
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
## 2024-06-02 ENCOUNTER — Encounter (HOSPITAL_BASED_OUTPATIENT_CLINIC_OR_DEPARTMENT_OTHER): Payer: Self-pay

## 2024-06-02 ENCOUNTER — Emergency Department (HOSPITAL_BASED_OUTPATIENT_CLINIC_OR_DEPARTMENT_OTHER)
Admission: EM | Admit: 2024-06-02 | Discharge: 2024-06-02 | Disposition: A | Discharge status: Home / Self Care | Payer: Self-pay | Attending: Emergency Medicine | Admitting: Emergency Medicine

## 2024-06-02 ENCOUNTER — Other Ambulatory Visit: Payer: Self-pay

## 2024-06-02 DIAGNOSIS — S60221A Contusion of right hand, initial encounter: Secondary | ICD-10-CM | POA: Insufficient documentation

## 2024-06-02 DIAGNOSIS — S0003XA Contusion of scalp, initial encounter: Secondary | ICD-10-CM | POA: Insufficient documentation

## 2024-06-02 MED ORDER — IBUPROFEN 400 MG PO TABS
600.0000 mg | ORAL_TABLET | Freq: Once | ORAL | Status: AC
  Administered 2024-06-02: 600 mg via ORAL
  Filled 2024-06-02: qty 1
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
## 2024-06-02 NOTE — Discharge Instructions (Signed)
 Get plenty of rest over the next couple of days. Take ibuprofen  as we discussed. You can add Tylenol  as needed.   Return to the ED with any new or worsening symptoms at any time.

## 2024-09-01 ENCOUNTER — Emergency Department (HOSPITAL_BASED_OUTPATIENT_CLINIC_OR_DEPARTMENT_OTHER)
Admission: EM | Admit: 2024-09-01 | Discharge: 2024-09-01 | Disposition: A | Discharge status: Home / Self Care | Payer: Self-pay | Attending: Emergency Medicine | Admitting: Emergency Medicine

## 2024-09-01 ENCOUNTER — Other Ambulatory Visit: Payer: Self-pay

## 2024-09-01 ENCOUNTER — Encounter (HOSPITAL_BASED_OUTPATIENT_CLINIC_OR_DEPARTMENT_OTHER): Payer: Self-pay

## 2024-09-01 ENCOUNTER — Emergency Department (HOSPITAL_BASED_OUTPATIENT_CLINIC_OR_DEPARTMENT_OTHER): Payer: Self-pay

## 2024-09-01 DIAGNOSIS — L03115 Cellulitis of right lower limb: Secondary | ICD-10-CM | POA: Insufficient documentation

## 2024-09-01 DIAGNOSIS — B353 Tinea pedis: Secondary | ICD-10-CM | POA: Insufficient documentation

## 2024-09-01 LAB — CBG MONITORING, ED: Glucose-Capillary: 138 mg/dL — ABNORMAL HIGH (ref 70–99)

## 2024-09-01 MED ORDER — FLUCONAZOLE 150 MG PO TABS
150.0000 mg | ORAL_TABLET | Freq: Once | ORAL | Status: AC
  Administered 2024-09-01: 150 mg via ORAL
  Filled 2024-09-01: qty 1

## 2024-09-01 MED ORDER — CLOTRIMAZOLE 1 % EX CREA: TOPICAL_CREAM | CUTANEOUS | 1 refills | Status: AC

## 2024-09-01 MED ORDER — CEPHALEXIN 500 MG PO CAPS: 500.0000 mg | ORAL_CAPSULE | Freq: Four times a day (QID) | ORAL | 0 refills | Status: AC

## 2024-09-01 NOTE — ED Provider Notes (Signed)
 [place] [facility] [place] Provider [place]   CSN: [id number] Arrival date & time: 09/01/24  1440     Patient presents with: Foot Pain   Norman Stewart is a 33 y.o. male.   Patient is a 33 year old male who presents with right foot pain.  He says been hurting about a week.  He has some white lesions and sores on the bottom of his foot.  He thinks he may have gotten infection from someone's shower.  He denies any prior issues with his feet.  No history of diabetes.  No fevers.  No drainage.  He denies any injury to the foot.       Prior to Admission medications   Medication Sig Start Date End Date Taking? Authorizing Provider  cephALEXin  (KEFLEX ) 500 MG capsule Take 1 capsule (500 mg total) by mouth 4 (four) times daily. 09/01/24  Yes Lenor Hollering, MD  clotrimazole  (LOTRIMIN ) 1 % cream Apply to affected area 2 times daily 09/01/24  Yes Lenor Hollering, MD  baclofen  (LIORESAL ) 10 MG tablet Take 1 tablet (10 mg total) by mouth 3 (three) times daily. 05/16/24   Reddick, Johnathan B, NP  diclofenac  (VOLTAREN ) 50 MG EC tablet Take 1 tablet (50 mg total) by mouth 2 (two) times daily. 05/16/24   Reddick, Johnathan B, NP    Allergies: Patient has no known allergies.    Review of Systems  Constitutional:  Negative for fever.  Gastrointestinal:  Negative for nausea and vomiting.  Musculoskeletal:  Positive for arthralgias. Negative for back pain, joint swelling and neck pain.  Skin:  Positive for wound.  Neurological:  Negative for weakness, numbness and headaches.    Updated Vital Signs BP (!) 150/74 (BP Location: Right Arm)   Pulse 98   Temp 98.4 F (36.9 C) (Oral)   Resp 20   Ht 5' 5 (1.651 m)   Wt 91.2 kg   SpO2 98%   BMI 33.45 kg/m   Physical Exam Constitutional:      Appearance: He is well-developed.  HENT:     Head: Normocephalic and atraumatic.  Cardiovascular:     Rate and Rhythm: Normal rate.  Pulmonary:     Effort: Pulmonary  effort is normal.  Musculoskeletal:        General: Tenderness present.     Cervical back: Normal range of motion and neck supple.     Comments: Patient has scaliness of the ball of the foot and around the toes.  There is white exudative material in between the toes.  No open blisters.  There is a scab over the fifth MTP joint with bony tenderness to this area.  No purulent drainage from any wounds.  There are some mild warmth and erythema to the dorsum of the foot.  Neuro vastly intact.  Pedal pulses intact.  Skin:    General: Skin is warm and dry.  Neurological:     Mental Status: He is alert and oriented to person, place, and time.     (all labs ordered are listed, but only abnormal results are displayed) Labs Reviewed  CBG MONITORING, ED - Abnormal; Notable for the following components:      Result Value   Glucose-Capillary 138 (*)    All other components within normal limits    EKG: None  Radiology: DG Foot Complete Right Result Date: 09/01/2024 EXAM: 3 VIEW(S) XRAY OF THE RIGHT FOOT 09/01/2024 03:31:00 PM COMPARISON: Comparison with previous study dated 03/23/2023. CLINICAL HISTORY: pain/sore  over 5th MTP joint FINDINGS: BONES AND JOINTS: Mild hallux valgus deformity. No acute fracture. No focal osseous lesion. No joint dislocation. SOFT TISSUES: The soft tissues are unremarkable. IMPRESSION: 1. No acute findings. 2. Mild hallux valgus deformity, unchanged from 03/23/2023, which may contribute to forefoot symptoms. Electronically signed by: Elsie Gravely MD 09/01/2024 03:39 PM EST RP Workstation: [id number]     Procedures   Medications Ordered in the ED  fluconazole  (DIFLUCAN ) tablet 150 mg (has no administration in time range)                                    Medical Decision Making Amount and/or Complexity of Data Reviewed Radiology: ordered.  Risk Prescription drug management.   This patient presents to the ED for concern of foot pain, this involves an  extensive number of treatment options, and is a complaint that carries with it a high risk of complications and morbidity.  I considered the following differential and admission for this acute, potentially life threatening condition.  The differential diagnosis includes tinea pedis, cellulitis, osteomyelitis, fracture  MDM:    Patient is a 33 year old who presents with pain in his right foot.  He has findings consistent with likely tinea pedis with scaliness of the skin and white exudative material between the toes concerning for a yeast infection.  He does have some bony tenderness to the fifth MTP joint with an overlying healing scabbed wound.  X-ray of the right foot was performed which did not show any evidence of bony destruction or fracture.  I suspect his symptoms are due to tinea pedis.  However he has a little bit of warmth and erythema to the dorsum of the foot concerning for possibly overlying bacterial infection.  Will start him on clotrimazole  ointment as well as Keflex  for possible cellulitis.  He was given dose of fluconazole  here in the ED.  He was advised to keep his feet clean and dry.  He was discharged home in good condition.  He was encouraged to follow-up with a primary care doctor if his symptoms are not improving.  Return precautions were given.  (Labs, imaging, consults)  Labs: I Ordered, and personally interpreted labs.  The pertinent results include: CBC mildly elevated  Imaging Studies ordered: I ordered imaging studies including right foot I independently visualized and interpreted imaging. I agree with the radiologist interpretation  Additional history obtained from chart.  External records from outside source obtained and reviewed including prior notes  Cardiac Monitoring: The patient was not maintained on a cardiac monitor.  If on the cardiac monitor, I personally viewed and interpreted the cardiac monitored which showed an underlying rhythm of:     Reevaluation: After the interventions noted above, I reevaluated the patient and found that they have :stayed the same  Social Determinants of Health:    Disposition: Discharged to home  Co morbidities that complicate the patient evaluation  Past Medical History:  Diagnosis Date   Headache      Medicines Meds ordered this encounter  Medications   fluconazole  (DIFLUCAN ) tablet 150 mg   clotrimazole  (LOTRIMIN ) 1 % cream    Sig: Apply to affected area 2 times daily    Dispense:  15 g    Refill:  1   cephALEXin  (KEFLEX ) 500 MG capsule    Sig: Take 1 capsule (500 mg total) by mouth 4 (four) times daily.    Dispense:  28 capsule    Refill:  0    I have reviewed the patients home medicines and have made adjustments as needed  Problem List / ED Course: Problem List Items Addressed This Visit   None Visit Diagnoses       Tinea pedis of right foot    -  Primary   Relevant Medications   fluconazole  (DIFLUCAN ) tablet 150 mg (Start on 09/01/2024  4:00 PM)   clotrimazole  (LOTRIMIN ) 1 % cream   cephALEXin  (KEFLEX ) 500 MG capsule     Cellulitis of right lower extremity                    Final diagnoses:  Tinea pedis of right foot  Cellulitis of right lower extremity    ED Discharge Orders          Ordered    clotrimazole  (LOTRIMIN ) 1 % cream        09/01/24 1557    cephALEXin  (KEFLEX ) 500 MG capsule  4 times daily        09/01/24 1557               Lenor Hollering, MD 09/01/24 1600

## 2024-09-01 NOTE — ED Triage Notes (Signed)
 Pt reports that bottom of right foot has had blisters on it. Denies injury. States that he possibly got an infection from someone's shower. Pt is not diabetic.

## 2024-09-01 NOTE — ED Notes (Signed)
 Pt CBG 138. RN notified.
